# Patient Record
Sex: Male | Born: 1960 | Race: White | Hispanic: No | Marital: Married | Smoking: Never smoker
Health system: Southern US, Community
[De-identification: ages and names within clinical notes are randomized; demographics above are authoritative.]

## PROBLEM LIST (undated history)

## (undated) DIAGNOSIS — G709 Myoneural disorder, unspecified: Secondary | ICD-10-CM

## (undated) DIAGNOSIS — M1990 Unspecified osteoarthritis, unspecified site: Secondary | ICD-10-CM

## (undated) DIAGNOSIS — I739 Peripheral vascular disease, unspecified: Secondary | ICD-10-CM

## (undated) DIAGNOSIS — L409 Psoriasis, unspecified: Secondary | ICD-10-CM

## (undated) DIAGNOSIS — I493 Ventricular premature depolarization: Secondary | ICD-10-CM

## (undated) DIAGNOSIS — I499 Cardiac arrhythmia, unspecified: Secondary | ICD-10-CM

## (undated) DIAGNOSIS — K219 Gastro-esophageal reflux disease without esophagitis: Secondary | ICD-10-CM

## (undated) DIAGNOSIS — F319 Bipolar disorder, unspecified: Secondary | ICD-10-CM

## (undated) DIAGNOSIS — Z8489 Family history of other specified conditions: Secondary | ICD-10-CM

## (undated) DIAGNOSIS — I10 Essential (primary) hypertension: Secondary | ICD-10-CM

## (undated) HISTORY — DX: Ventricular premature depolarization: I49.3

## (undated) HISTORY — PX: WISDOM TOOTH EXTRACTION: SHX21

---

## 1998-04-08 ENCOUNTER — Ambulatory Visit (HOSPITAL_COMMUNITY): Admission: RE | Admit: 1998-04-08 | Discharge: 1998-04-08 | Payer: Self-pay | Admitting: Gynecology

## 2004-07-18 HISTORY — PX: NASAL SEPTUM SURGERY: SHX37

## 2005-07-21 ENCOUNTER — Ambulatory Visit: Payer: Self-pay | Admitting: Otolaryngology

## 2010-01-29 ENCOUNTER — Ambulatory Visit: Payer: Self-pay | Admitting: Unknown Physician Specialty

## 2014-07-02 DIAGNOSIS — M5440 Lumbago with sciatica, unspecified side: Secondary | ICD-10-CM | POA: Insufficient documentation

## 2014-07-02 DIAGNOSIS — M79606 Pain in leg, unspecified: Secondary | ICD-10-CM | POA: Insufficient documentation

## 2014-07-02 DIAGNOSIS — R200 Anesthesia of skin: Secondary | ICD-10-CM | POA: Insufficient documentation

## 2014-07-02 DIAGNOSIS — R202 Paresthesia of skin: Secondary | ICD-10-CM | POA: Insufficient documentation

## 2015-02-05 ENCOUNTER — Emergency Department: Payer: BC Managed Care – PPO

## 2015-02-05 ENCOUNTER — Inpatient Hospital Stay
Admission: EM | Admit: 2015-02-05 | Discharge: 2015-02-11 | DRG: 372 | Disposition: A | Discharge status: Home / Self Care | Payer: BC Managed Care – PPO | Attending: Internal Medicine | Admitting: Internal Medicine

## 2015-02-05 ENCOUNTER — Encounter: Payer: Self-pay | Admitting: Emergency Medicine

## 2015-02-05 DIAGNOSIS — E876 Hypokalemia: Secondary | ICD-10-CM | POA: Diagnosis present

## 2015-02-05 DIAGNOSIS — K219 Gastro-esophageal reflux disease without esophagitis: Secondary | ICD-10-CM | POA: Diagnosis present

## 2015-02-05 DIAGNOSIS — Z79899 Other long term (current) drug therapy: Secondary | ICD-10-CM | POA: Diagnosis not present

## 2015-02-05 DIAGNOSIS — A045 Campylobacter enteritis: Principal | ICD-10-CM | POA: Diagnosis present

## 2015-02-05 DIAGNOSIS — R140 Abdominal distension (gaseous): Secondary | ICD-10-CM

## 2015-02-05 DIAGNOSIS — I10 Essential (primary) hypertension: Secondary | ICD-10-CM | POA: Diagnosis present

## 2015-02-05 DIAGNOSIS — D899 Disorder involving the immune mechanism, unspecified: Secondary | ICD-10-CM | POA: Diagnosis present

## 2015-02-05 DIAGNOSIS — E871 Hypo-osmolality and hyponatremia: Secondary | ICD-10-CM | POA: Diagnosis present

## 2015-02-05 DIAGNOSIS — L409 Psoriasis, unspecified: Secondary | ICD-10-CM | POA: Diagnosis present

## 2015-02-05 DIAGNOSIS — K529 Noninfective gastroenteritis and colitis, unspecified: Secondary | ICD-10-CM

## 2015-02-05 DIAGNOSIS — F319 Bipolar disorder, unspecified: Secondary | ICD-10-CM | POA: Diagnosis present

## 2015-02-05 DIAGNOSIS — K922 Gastrointestinal hemorrhage, unspecified: Secondary | ICD-10-CM | POA: Diagnosis present

## 2015-02-05 DIAGNOSIS — R109 Unspecified abdominal pain: Secondary | ICD-10-CM

## 2015-02-05 DIAGNOSIS — R100 Acute abdomen: Secondary | ICD-10-CM | POA: Diagnosis present

## 2015-02-05 HISTORY — DX: Bipolar disorder, unspecified: F31.9

## 2015-02-05 HISTORY — DX: Psoriasis, unspecified: L40.9

## 2015-02-05 HISTORY — DX: Essential (primary) hypertension: I10

## 2015-02-05 HISTORY — DX: Gastro-esophageal reflux disease without esophagitis: K21.9

## 2015-02-05 LAB — COMPREHENSIVE METABOLIC PANEL
ALT: 30 U/L (ref 17–63)
ANION GAP: 10 (ref 5–15)
AST: 27 U/L (ref 15–41)
Albumin: 4.1 g/dL (ref 3.5–5.0)
Alkaline Phosphatase: 54 U/L (ref 38–126)
BUN: 14 mg/dL (ref 6–20)
CO2: 26 mmol/L (ref 22–32)
Calcium: 8.8 mg/dL — ABNORMAL LOW (ref 8.9–10.3)
Chloride: 98 mmol/L — ABNORMAL LOW (ref 101–111)
Creatinine, Ser: 0.93 mg/dL (ref 0.61–1.24)
GFR calc Af Amer: >60 mL/min (ref >60)
GFR calc non Af Amer: >60 mL/min (ref >60)
GLUCOSE: 127 mg/dL — AB (ref 65–99)
Potassium: 4.1 mmol/L (ref 3.5–5.1)
Sodium: 134 mmol/L — ABNORMAL LOW (ref 135–145)
TOTAL PROTEIN: 7 g/dL (ref 6.5–8.1)
Total Bilirubin: 0.7 mg/dL (ref 0.3–1.2)

## 2015-02-05 LAB — URINALYSIS COMPLETE WITH MICROSCOPIC (ARMC ONLY)
Bacteria, UA: NONE SEEN
Bilirubin Urine: NEGATIVE
Glucose, UA: NEGATIVE mg/dL
Hgb urine dipstick: NEGATIVE
Leukocytes, UA: NEGATIVE
NITRITE: NEGATIVE
PROTEIN: NEGATIVE mg/dL
pH: 6 (ref 5.0–8.0)

## 2015-02-05 LAB — PROTIME-INR
INR: 1.04
Prothrombin Time: 13.8 seconds (ref 11.4–15.0)

## 2015-02-05 LAB — CBC
HEMATOCRIT: 49.4 % (ref 40.0–52.0)
Hemoglobin: 16.6 g/dL (ref 13.0–18.0)
MCH: 31.1 pg (ref 26.0–34.0)
MCHC: 33.7 g/dL (ref 32.0–36.0)
MCV: 92.4 fL (ref 80.0–100.0)
PLATELETS: 227 10*3/uL (ref 150–440)
RBC: 5.35 MIL/uL (ref 4.40–5.90)
RDW: 13.5 % (ref 11.5–14.5)
WBC: 13.7 10*3/uL — AB (ref 3.8–10.6)

## 2015-02-05 LAB — C DIFFICILE QUICK SCREEN W PCR REFLEX
C Diff antigen: NEGATIVE
C Diff interpretation: NEGATIVE
C Diff toxin: NEGATIVE

## 2015-02-05 LAB — LIPASE, BLOOD: LIPASE: 11 U/L — AB (ref 22–51)

## 2015-02-05 LAB — TYPE AND SCREEN
ABO/RH(D): O POS
Antibody Screen: NEGATIVE

## 2015-02-05 LAB — HEMOGLOBIN AND HEMATOCRIT, BLOOD
HCT: 49.1 % (ref 40.0–52.0)
HEMATOCRIT: 46.7 % (ref 40.0–52.0)
HEMOGLOBIN: 15.8 g/dL (ref 13.0–18.0)
Hemoglobin: 16.8 g/dL (ref 13.0–18.0)

## 2015-02-05 LAB — OCCULT BLOOD X 1 CARD TO LAB, STOOL: FECAL OCCULT BLD: POSITIVE — AB

## 2015-02-05 LAB — ABO/RH: ABO/RH(D): O POS

## 2015-02-05 MED ORDER — ACETAMINOPHEN 325 MG PO TABS: 650.0000 mg | ORAL_TABLET | Freq: Four times a day (QID) | ORAL | Status: DC | PRN

## 2015-02-05 MED ORDER — ONDANSETRON HCL 4 MG/2ML IJ SOLN
4.0000 mg | Freq: Once | INTRAMUSCULAR | Status: AC
  Administered 2015-02-05: 4 mg via INTRAVENOUS

## 2015-02-05 MED ORDER — IOHEXOL 240 MG/ML SOLN
25.0000 mL | Freq: Once | INTRAMUSCULAR | Status: AC | PRN
  Administered 2015-02-05: 50 mL via ORAL

## 2015-02-05 MED ORDER — METRONIDAZOLE IN NACL 5-0.79 MG/ML-% IV SOLN
500.0000 mg | Freq: Once | INTRAVENOUS | Status: AC
  Administered 2015-02-05: 500 mg via INTRAVENOUS
  Filled 2015-02-05: qty 100

## 2015-02-05 MED ORDER — CIPROFLOXACIN IN D5W 400 MG/200ML IV SOLN
400.0000 mg | Freq: Two times a day (BID) | INTRAVENOUS | Status: DC
Start: 2015-02-06 — End: 2015-02-07
  Administered 2015-02-06 – 2015-02-07 (×3): 400 mg via INTRAVENOUS
  Filled 2015-02-05 (×6): qty 200

## 2015-02-05 MED ORDER — PANTOPRAZOLE SODIUM 40 MG PO TBEC
40.0000 mg | DELAYED_RELEASE_TABLET | Freq: Every day | ORAL | Status: DC
  Administered 2015-02-06 – 2015-02-11 (×6): 40 mg via ORAL
  Filled 2015-02-05 (×6): qty 1

## 2015-02-05 MED ORDER — ONDANSETRON HCL 4 MG/2ML IJ SOLN
4.0000 mg | Freq: Four times a day (QID) | INTRAMUSCULAR | Status: DC | PRN
  Administered 2015-02-05 – 2015-02-06 (×2): 4 mg via INTRAVENOUS
  Filled 2015-02-05 (×2): qty 2

## 2015-02-05 MED ORDER — OXYCODONE HCL 5 MG PO TABS
5.0000 mg | ORAL_TABLET | ORAL | Status: DC | PRN
  Administered 2015-02-06 – 2015-02-10 (×10): 5 mg via ORAL
  Filled 2015-02-05 (×10): qty 1

## 2015-02-05 MED ORDER — IOHEXOL 240 MG/ML SOLN: 25.0000 mL | Freq: Once | INTRAMUSCULAR | Status: DC | PRN

## 2015-02-05 MED ORDER — ONDANSETRON HCL 4 MG/2ML IJ SOLN
INTRAMUSCULAR | Status: AC
  Administered 2015-02-05: 4 mg via INTRAVENOUS
  Filled 2015-02-05: qty 2

## 2015-02-05 MED ORDER — BUPROPION HCL ER (XL) 150 MG PO TB24
150.0000 mg | ORAL_TABLET | Freq: Every day | ORAL | Status: DC
  Administered 2015-02-06 – 2015-02-11 (×6): 150 mg via ORAL
  Filled 2015-02-05 (×6): qty 1

## 2015-02-05 MED ORDER — PANTOPRAZOLE SODIUM 40 MG IV SOLR
40.0000 mg | Freq: Once | INTRAVENOUS | Status: AC
  Administered 2015-02-05: 40 mg via INTRAVENOUS
  Filled 2015-02-05: qty 40

## 2015-02-05 MED ORDER — METRONIDAZOLE IN NACL 5-0.79 MG/ML-% IV SOLN
500.0000 mg | Freq: Three times a day (TID) | INTRAVENOUS | Status: DC
  Administered 2015-02-05 – 2015-02-07 (×5): 500 mg via INTRAVENOUS
  Filled 2015-02-05 (×9): qty 100

## 2015-02-05 MED ORDER — IOHEXOL 350 MG/ML SOLN
100.0000 mL | Freq: Once | INTRAVENOUS | Status: AC | PRN
  Administered 2015-02-05: 100 mL via INTRAVENOUS

## 2015-02-05 MED ORDER — SODIUM CHLORIDE 0.9 % IV BOLUS (SEPSIS)
1000.0000 mL | Freq: Once | INTRAVENOUS | Status: AC
  Administered 2015-02-05: 1000 mL via INTRAVENOUS

## 2015-02-05 MED ORDER — PROMETHAZINE HCL 25 MG/ML IJ SOLN
12.5000 mg | Freq: Once | INTRAMUSCULAR | Status: AC
  Administered 2015-02-05: 12.5 mg via INTRAVENOUS
  Filled 2015-02-05: qty 1

## 2015-02-05 MED ORDER — METOPROLOL TARTRATE 25 MG PO TABS
25.0000 mg | ORAL_TABLET | Freq: Two times a day (BID) | ORAL | Status: DC
  Administered 2015-02-06: 25 mg via ORAL
  Filled 2015-02-05 (×2): qty 1

## 2015-02-05 MED ORDER — SODIUM CHLORIDE 0.9 % IV SOLN
Freq: Once | INTRAVENOUS | Status: AC
  Administered 2015-02-05: 10:00:00 via INTRAVENOUS

## 2015-02-05 MED ORDER — CIPROFLOXACIN IN D5W 400 MG/200ML IV SOLN
400.0000 mg | Freq: Once | INTRAVENOUS | Status: AC
  Administered 2015-02-05: 400 mg via INTRAVENOUS
  Filled 2015-02-05: qty 200

## 2015-02-05 MED ORDER — SODIUM CHLORIDE 0.9 % IV SOLN
INTRAVENOUS | Status: DC
  Administered 2015-02-05 (×2): via INTRAVENOUS

## 2015-02-05 MED ORDER — ONDANSETRON HCL 4 MG PO TABS: 4.0000 mg | ORAL_TABLET | Freq: Four times a day (QID) | ORAL | Status: DC | PRN

## 2015-02-05 MED ORDER — LAMOTRIGINE 25 MG PO TABS
75.0000 mg | ORAL_TABLET | Freq: Every day | ORAL | Status: DC
  Administered 2015-02-06 – 2015-02-11 (×6): 75 mg via ORAL
  Filled 2015-02-05 (×6): qty 3

## 2015-02-05 MED ORDER — MORPHINE SULFATE 2 MG/ML IJ SOLN
2.0000 mg | INTRAMUSCULAR | Status: DC | PRN
  Administered 2015-02-05 – 2015-02-07 (×3): 2 mg via INTRAVENOUS
  Filled 2015-02-05 (×3): qty 1

## 2015-02-05 MED ORDER — SODIUM CHLORIDE 0.9 % IV SOLN
INTRAVENOUS | Status: DC
  Administered 2015-02-06 – 2015-02-07 (×2): via INTRAVENOUS

## 2015-02-05 MED ORDER — ACETAMINOPHEN 650 MG RE SUPP: 650.0000 mg | Freq: Four times a day (QID) | RECTAL | Status: DC | PRN

## 2015-02-05 NOTE — ED Provider Notes (Signed)
[facility] Emergency Department Provider Note ____________________________________________  Time seen: Approximately 9:13 AM  I have reviewed the triage vital signs and the nursing notes.   HISTORY  Chief Complaint Diarrhea and Blood In Stools  HPI Bruce Chambers. is a 54 y.o. male who started having midepigastric abdominal pain yesterday morning and throughout the day his pain continued and he was having diarrhea. Patient started passing bright red blood per rectum that got worse this morning. Patient states that he had several stools this morning with bright red blood. Patient also states that he has had some nausea with retching but no actual vomiting. Patient does take a Goody powder every day and he does drink alcohol. Patient denies any fever, but the wife states that he felt hot yesterday and he was having chills. Patient denies any cough, congestion, dysuria, frequency, hematuria, or back pain. Patient states that this morning after more episodes of diarrhea and rectal bleeding he started to have some dizziness especially with standing. On arrival to the ER patient stated that he felt like he was dehydrated.   Past Medical History  Diagnosis Date  . Hypertension   . Bipolar 1 disorder   . GERD (gastroesophageal reflux disease)   . Psoriasis     There are no active problems to display for this patient.   History reviewed. No pertinent past surgical history.  Current Outpatient Rx  Name  Route  Sig  Dispense  Refill  . Adalimumab 40 MG/0.8ML PNKT   Subcutaneous   Inject 40 mg into the skin every 14 (fourteen) days.         Marland Kitchen buPROPion (WELLBUTRIN XL) 150 MG 24 hr tablet   Oral   Take 1 tablet by mouth daily.         Marland Kitchen lamoTRIgine (LAMICTAL) 150 MG tablet   Oral   Take 75 mg by mouth daily.           Allergies Review of patient's allergies indicates no known allergies.  No family history on file.  Social History History   Substance Use Topics  . Smoking status: Never Smoker   . Smokeless tobacco: Not on file  . Alcohol Use: Not on file    Review of Systems Constitutional: No fever/chills Eyes: No visual changes. ENT: No sore throat. Cardiovascular: Denies chest pain. Respiratory: Denies shortness of breath. Gastrointestinal: Patient complaining of midepigastric abdominal pain.  Patient with nausea and retching. Patient multiple episodes of diarrhea and bright red rectal bleeding.  No constipation. Genitourinary: Negative for dysuria. Musculoskeletal: Negative for back pain. Skin: Negative for rash. Neurological: Negative for headaches, focal weakness or numbness.  10-point ROS otherwise negative.  ____________________________________________   PHYSICAL EXAM:  VITAL SIGNS: ED Triage Vitals  Enc Vitals Group     BP 02/05/15 0837 172/104 mmHg     Pulse --      Resp 02/05/15 0837 20     Temp 02/05/15 0837 97.8 F (36.6 C)     Temp Source 02/05/15 0837 Oral     SpO2 02/05/15 0837 98 %     Weight 02/05/15 0837 200 lb (90.719 kg)     Height 02/05/15 0837 6\' 1"  (1.854 m)     Head Cir --      Peak Flow --      Pain Score 02/05/15 0838 5     Pain Loc --      Pain Edu? --      Excl. in [place]? --  Constitutional: Alert and oriented. Well appearing and in mild distress secondary to his pain. Eyes: Conjunctivae are normal. PERRL. EOMI. Head: Atraumatic. Nose: No congestion/rhinnorhea. Mouth/Throat: Mucous membranes are dry.  Oropharynx non-erythematous. Neck: No stridor.   Cardiovascular: Normal rate, regular rhythm. Grossly normal heart sounds.  Good peripheral circulation. Respiratory: Normal respiratory effort.  No retractions. Lungs CTAB. Gastrointestinal: Soft and tender to the palpation in the midepigastric area. No distention. No abdominal bruits. No CVA tenderness. Hemoccult showed patient had no significant blood in the vault but he was grossly heme positive Musculoskeletal: No lower  extremity tenderness nor edema.  No joint effusions. Neurologic:  Normal speech and language. No gross focal neurologic deficits are appreciated. No gait instability. Skin:  Skin is warm, dry and intact. No rash noted. Psychiatric: Mood and affect are normal. Speech and behavior are normal.  ____________________________________________   LABS (all labs ordered are listed, but only abnormal results are displayed)  Labs Reviewed  CBC - Abnormal; Notable for the following:    WBC 13.7 (*)    All other components within normal limits  COMPREHENSIVE METABOLIC PANEL - Abnormal; Notable for the following:    Sodium 134 (*)    Chloride 98 (*)    Glucose, Bld 127 (*)    Calcium 8.8 (*)    All other components within normal limits  LIPASE, BLOOD - Abnormal; Notable for the following:    Lipase 11 (*)    All other components within normal limits  PROTIME-INR  URINALYSIS COMPLETEWITH MICROSCOPIC ([facility] ONLY)   ____________________________________________  EKG ED ECG REPORT I, Ruby Cola, the attending physician, personally viewed and interpreted this ECG.   Date: 02/05/2015  EKG Time: 8:44 AM  Rate: 68  Rhythm: normal EKG, normal sinus rhythm, unchanged from previous tracings, normal sinus rhythm  Axis: Normal  Intervals:none  ST&T Change: None  ____________________________________________  RADIOLOGY Ct Abdomen Pelvis W Contrast  02/05/2015   CLINICAL DATA:  Patient presents to the emergency department for diarrhea since yesterday and bright red bloody stools this morning. Patient also describes centralized abdominal pain above umbilicus.  EXAM: CT ABDOMEN AND PELVIS WITH CONTRAST  TECHNIQUE: Multidetector CT imaging of the abdomen and pelvis was performed using the standard protocol following bolus administration of intravenous contrast.  CONTRAST:  168mL OMNIPAQUE IOHEXOL 350 MG/ML SOLN  COMPARISON:  None.  FINDINGS: There is prominent thickening of the walls of the transverse  colon and right colon. There is associated paracolic fluid stranding adjacent to the right colon. There is milder thickening of the walls of the descending colon. Rectosigmoid colon appears normal. No large bowel dilatation. Small bowel is normal in caliber and configuration. Stomach is unremarkable.  Liver, gallbladder, spleen, pancreas, adrenal glands, and kidneys appear normal. Abdominal aorta is normal in caliber and configuration. No circumscribed fluid collection or abscess collection identified in the abdomen or pelvis. No free intraperitoneal air. No pneumatosis intestinalis.  Lung bases are clear. Degenerative changes noted throughout the thoracolumbar spine, mild moderate in degree, but no acute osseous abnormality.  IMPRESSION: Pronounced thickening of the walls of the ascending colon and transverse colon, with milder thickening of the walls of the descending colon, consistent with a diffuse colitis of infectious or inflammatory nature. There is associated paracolic fluid stranding/inflammation. No paracolic abscess collection seen. No free intraperitoneal air. No associated bowel obstruction.  Remainder of the abdomen and pelvis CT examination is unremarkable, as detailed above.   Electronically Signed   By: Roxy Horseman.D.  On: 02/05/2015 11:53   ____________________________________________   PROCEDURES  Procedure(s) performed: None  Critical Care performed: No  ____________________________________________   INITIAL IMPRESSION / ASSESSMENT AND PLAN / ED COURSE  Pertinent labs & imaging results that were available during my care of the patient were reviewed by me and considered in my medical decision making (see chart for details).  ----------------------------------------- 9:18 AM on 02/05/2015 -----------------------------------------  Patient will be given some IV morphine and Zofran along with some IV fluids. Patient will also get CT abdomen and pelvis to evaluate his  abdominal pain and be given some IV Protonix for possible peptic ulcer. ____________________________________________  ----------------------------------------- 12:51 PM on 02/05/2015 -----------------------------------------  Patient's CT scan showed diffuse colitis. Patient was started on IV Cipro and Flagyl in the ED and he is going to be admitted by Dr.Gouru for colitis and GI bleed.  FINAL CLINICAL IMPRESSION(S) / ED DIAGNOSES  Final diagnoses:  Acute abdominal pain  Gastrointestinal hemorrhage, unspecified gastritis, unspecified gastrointestinal hemorrhage type  Colitis      Ruby Cola, MD 02/05/15 1253

## 2015-02-05 NOTE — Consult Note (Signed)
Pt with severe onset of diarrhea follow by bloody diarrhea and vomiting.  CT scan showed thickening of the walls of the transverse and right colon with some paracolic fluid stranding.  He was placed on Cipro and Flagyl in ER before cultures obtained.  This is likely food poisoning but can't rule out other causes.  He recently completed a trip white water rafting at the [facility] and could have picked something up there.  I agree with the antibiotics and told the patient he will likely be here 3-4 days more at least.  We will follow with you.

## 2015-02-05 NOTE — ED Notes (Signed)
Patient presents to the ED for diarrhea since yesterday and bright red bloody stools this morning.  Patient reports feeling nauseous and dry heaving.  Denies vomiting.  Patient reports centralized abdominal pain above his umbilicus.  Patient reports area is slightly tender.  Patient reports fever yesterday that broke after taking ibuprofen.  Patient reports weakness and dizziness.

## 2015-02-05 NOTE — H&P (Signed)
[facility] NAME: Bruce Chambers    MR#:  [id number]  DATE OF BIRTH:  [birth year]  DATE OF ADMISSION:  02/05/2015  PRIMARY CARE PHYSICIAN: No primary care provider on file.   REQUESTING/REFERRING PHYSICIAN: Dr. Lovena Le  CHIEF COMPLAINT:  Diarrhea with blood and epigastric abdominal pain  HISTORY OF PRESENT ILLNESS:  Bruce Chambers  is a 54 y.o. male with a known history of GERD, bipolar disorder, hypertension and psoriasis is presenting to the ED with a chief complaint of midepigastric abdominal pain from yesterday morning associated with diarrhea with maroon-colored blood. These symptoms have gotten much worse from last night which prompted him to come to the ED. Patient admits that he takes [place] powders on daily basis. Denies any similar complaints in the past. Denies any use of antibiotics recently. Denies any dizziness or loss of consciousness. Denies any nausea or vomiting. CAT scan of the abdomen has revealed colitis of the colon. Patient's hemoglobin in the ED is at 16.6. He has received ciprofloxacin and Flagyl in the ED by the ED physician. Hospitalist team is called to admit the patient. During my examination patient is resting comfortably but still has some epigastric abdominal pain. Wife is at bedside  PAST MEDICAL HISTORY:   Past Medical History  Diagnosis Date  . Hypertension   . Bipolar 1 disorder   . GERD (gastroesophageal reflux disease)   . Psoriasis     PAST SURGICAL HISTOIRY:  History reviewed. No pertinent past surgical history.  SOCIAL HISTORY:   History  Substance Use Topics  . Smoking status: Never Smoker   . Smokeless tobacco: Not on file  . Alcohol Use: Not on file    FAMILY HISTORY:  No family history on file.  DRUG ALLERGIES:  No Known Allergies  REVIEW OF SYSTEMS:  CONSTITUTIONAL: No fever, fatigue or weakness.  EYES: No blurred or double vision.  EARS, NOSE, AND THROAT: No tinnitus or  ear pain.  RESPIRATORY: No cough, shortness of breath, wheezing or hemoptysis.  CARDIOVASCULAR: No chest pain, orthopnea, edema.  GASTROINTESTINAL: No nausea, vomiting, reporting maroon-colored bloody diarrhea and midepigastric abdominal pain.  GENITOURINARY: No dysuria, hematuria.  ENDOCRINE: No polyuria, nocturia,  HEMATOLOGY: No anemia, easy bruising or bleeding SKIN: No rash or lesion. MUSCULOSKELETAL: No joint pain or arthritis.   NEUROLOGIC: No tingling, numbness, weakness.  PSYCHIATRY: No anxiety or depression.   MEDICATIONS AT HOME:   Prior to Admission medications   Medication Sig Start Date End Date Taking? Authorizing Provider  Adalimumab 40 MG/0.8ML PNKT Inject 40 mg into the skin every 14 (fourteen) days.   Yes Historical Provider, MD  buPROPion (WELLBUTRIN XL) 150 MG 24 hr tablet Take 1 tablet by mouth daily.   Yes Historical Provider, MD  lamoTRIgine (LAMICTAL) 150 MG tablet Take 75 mg by mouth daily.   Yes Historical Provider, MD      VITAL SIGNS:  Blood pressure 172/106, pulse 69, temperature 97.8 F (36.6 C), temperature source Oral, resp. rate 27, height 6\' 1"  (1.854 m), weight 90.719 kg (200 lb), SpO2 99 %.  PHYSICAL EXAMINATION:  GENERAL:  54 y.o.-year-old patient lying in the bed with no acute distress.  EYES: Pupils equal, round, reactive to light and accommodation. No scleral icterus. Extraocular muscles intact.  HEENT: Head atraumatic, normocephalic. Oropharynx and nasopharynx clear.  NECK:  Supple, no jugular venous distention. No thyroid enlargement, no tenderness.  LUNGS: Normal breath sounds bilaterally, no wheezing, rales,rhonchi or crepitation. No  use of accessory muscles of respiration.  CARDIOVASCULAR: S1, S2 normal. No murmurs, rubs, or gallops.  ABDOMEN: Soft, epigastric tenderness, some distention, no rebound tenderness . Bowel sounds present. No organomegaly or mass.  EXTREMITIES: No pedal edema, cyanosis, or clubbing.  NEUROLOGIC: Cranial  nerves II through XII are intact. Muscle strength 5/5 in all extremities. Sensation intact. Gait not checked.  PSYCHIATRIC: The patient is alert and oriented x 3.  SKIN: No obvious rash, lesion, or ulcer.   LABORATORY PANEL:   CBC  Recent Labs Lab 02/05/15 0935  WBC 13.7*  HGB 16.6  HCT 49.4  PLT 227   ------------------------------------------------------------------------------------------------------------------  Chemistries   Recent Labs Lab 02/05/15 0935  NA 134*  K 4.1  CL 98*  CO2 26  GLUCOSE 127*  BUN 14  CREATININE 0.93  CALCIUM 8.8*  AST 27  ALT 30  ALKPHOS 54  BILITOT 0.7   ------------------------------------------------------------------------------------------------------------------  Cardiac Enzymes No results for input(s): TROPONINI in the last 168 hours. ------------------------------------------------------------------------------------------------------------------  RADIOLOGY:  Ct Abdomen Pelvis W Contrast  02/05/2015   CLINICAL DATA:  Patient presents to the emergency department for diarrhea since yesterday and bright red bloody stools this morning. Patient also describes centralized abdominal pain above umbilicus.  EXAM: CT ABDOMEN AND PELVIS WITH CONTRAST  TECHNIQUE: Multidetector CT imaging of the abdomen and pelvis was performed using the standard protocol following bolus administration of intravenous contrast.  CONTRAST:  115mL OMNIPAQUE IOHEXOL 350 MG/ML SOLN  COMPARISON:  None.  FINDINGS: There is prominent thickening of the walls of the transverse colon and right colon. There is associated paracolic fluid stranding adjacent to the right colon. There is milder thickening of the walls of the descending colon. Rectosigmoid colon appears normal. No large bowel dilatation. Small bowel is normal in caliber and configuration. Stomach is unremarkable.  Liver, gallbladder, spleen, pancreas, adrenal glands, and kidneys appear normal. Abdominal aorta is  normal in caliber and configuration. No circumscribed fluid collection or abscess collection identified in the abdomen or pelvis. No free intraperitoneal air. No pneumatosis intestinalis.  Lung bases are clear. Degenerative changes noted throughout the thoracolumbar spine, mild moderate in degree, but no acute osseous abnormality.  IMPRESSION: Pronounced thickening of the walls of the ascending colon and transverse colon, with milder thickening of the walls of the descending colon, consistent with a diffuse colitis of infectious or inflammatory nature. There is associated paracolic fluid stranding/inflammation. No paracolic abscess collection seen. No free intraperitoneal air. No associated bowel obstruction.  Remainder of the abdomen and pelvis CT examination is unremarkable, as detailed above.   Electronically Signed   By: Franki Cabot M.D.   On: 02/05/2015 11:53    EKG:  No orders found for this or any previous visit.  IMPRESSION AND PLAN:   Bruce Chambers  is a 54 y.o. male with a known history of GERD, bipolar disorder, hypertension and psoriasis is presenting to the ED with a chief complaint of midepigastric abdominal pain from yesterday morning associated with diarrhea with maroon-colored blood. CT abdomen has revealed diffuse colitis  Assessment and plan  #1 lower GI bleed secondary to diffuse colitis Will admit the patient to MedSurg unit Type and screen the blood Monitor hemoglobin and hematocrit every 6 hours and transfuse as needed basis. We will provide Protonix Gastroenterology consult is placed Will provide hydration with IV fluids Check stool for Hemoccult, will get Clostridium difficult   #2 epigastric abdominal pain with history of GERD and taking over-the-counter Goody powders on daily basis-could be  from end-stage induced gastritis versus gastroduodenal ulcer Advised patient to stop using over-the-counter Goody powders Avoid NSAIDs GI prophylaxis with Protonix GI consult  is placed  #3 history of bipolar disorder Resume his home medications  #4 history of hypertension with elevated blood pressure The patient is not on any home medications. Will continue close monitoring of his blood pressure We will start the patient on metoprolol and titrate as needed basis  Will provide GI prophylaxis with Protonix and DVT prophylaxis with SCDs  All the records are reviewed and case discussed with ED provider. Management plans discussed with the patient, family and they are in agreement.  CODE STATUS: Full code, wife is the healthcare power of attorney  TOTAL TIME TAKING CARE OF THIS PATIENT reviewing medical records, performing history and physical, admission orders, coordination of care, discussion with the RN and ED physician,:45 minutes      Nicholes Mango M.D on 02/05/2015 at 1:33 PM  Between 7am to 6pm - Pager - [phone]  After 6pm go to [email or web address] - password [email or web address]  [facility]  Office  [phone]  CC: Primary care physician; No primary care provider on file.

## 2015-02-05 NOTE — Progress Notes (Signed)
Spoke with Dr. Marcille Blanco about pts IV fluid order - is ordered to stop at midnight, however, pt not taking POs well, nauseated, still with loose stools - questioning whether to stop fluids - MD order ok to keep fluids continuous.

## 2015-02-05 NOTE — Consult Note (Signed)
GI Inpatient Consult Note  Reason for Consult: Colitis with GI bleed   Attending Requesting Consult: Dr. Mar Daring  History of Present Illness: Bruce Chambers. is a 54 y.o. male who reports that he started having diarrhea yesterday.  He reports he had countless episodes.  They started out as stool and then went to just liquid blood.  He described it as pure red blood.  He reports his normal bowel movements are  daily soft formed.  He reports acute epigastric pain that started at the same time.  He also reports nausea and vomiting started this morning.  He has been unable to hold down any type of liquids.  His wife was in the room with them and gave the majority of the history.  She reported that he felt really hot last night, but he would not allow her to take his temperature.  He has had a weight  loss of 10 pounds in the last few months.  They attribute that to extra exercise, he recently retired and is working outside a lot more.  He reports that he had been taking Nexium for GERD symptoms but decided not to take it anymore.  He reports he only gets heartburn and acid reflux in the morning with coffee.  He also reports that he takes [place] powders every day, for the last 6 years.  He reports he takes them for degenerative disc disease.  He drinks 2 glasses of wine every day and will eat late at night.   Of note, he just returned from a white water rafting trip at the [facility] on July 12. His wife reports that a few nights after they returned she had nausea, diarrhea and low grade fever for a couple of days.  He self reports that he had an upper endoscopy and a colonoscopy done approximately 5 years ago.  Past Medical History:  Past Medical History  Diagnosis Date  . Hypertension   . Bipolar 1 disorder   . GERD (gastroesophageal reflux disease)   . Psoriasis     Problem List: Patient Active Problem List   Diagnosis Date Noted  . Acute lower GI bleeding 02/05/2015    Past  Surgical History: History reviewed. No pertinent past surgical history.  Allergies: No Known Allergies  Home Medications: Prescriptions prior to admission  Medication Sig Dispense Refill Last Dose  . Adalimumab 40 MG/0.8ML PNKT Inject 40 mg into the skin every 14 (fourteen) days.   Past Week at Unknown time  . buPROPion (WELLBUTRIN XL) 150 MG 24 hr tablet Take 1 tablet by mouth daily.   02/04/2015 at Unknown time  . lamoTRIgine (LAMICTAL) 150 MG tablet Take 75 mg by mouth daily.   02/04/2015 at Unknown time   Home medication reconciliation was completed with the patient.   Scheduled Inpatient Medications:   . buPROPion  150 mg Oral Daily  . [START ON 02/06/2015] ciprofloxacin  400 mg Intravenous Q12H  . lamoTRIgine  75 mg Oral Daily  . metoprolol tartrate  25 mg Oral BID  . metronidazole  500 mg Intravenous Q8H  . pantoprazole  40 mg Oral Daily    Continuous Inpatient Infusions:   . sodium chloride 100 mL/hr at 02/05/15 1401    PRN Inpatient Medications:  acetaminophen **OR** acetaminophen, morphine injection, ondansetron **OR** ondansetron (ZOFRAN) IV, oxyCODONE  Family History: family history is not on file.  The patient's family history is negative for inflammatory bowel disorders, GI malignancy, or solid organ transplantation.  Social  History:   reports that he has never smoked. He does not have any smokeless tobacco history on file. He reports that he drinks about 1.8 oz of alcohol per week. The patient denies ETOH, tobacco, or drug use.   Review of Systems: Constitutional: 10 pound weight loss over past 2-3 months Eyes: No changes in vision. ENT: No oral lesions, sore throat.  GI: see HPI.  Heme/Lymph: No easy bruising.  CV: No chest pain.  GU: No hematuria.  Integumentary: No rashes.  Neuro: No headaches.  Psych: No depression/anxiety.  Endocrine: No heat/cold intolerance.  Allergic/Immunologic: No urticaria.  Resp: No cough, SOB.  Musculoskeletal: No joint  swelling.    Physical Examination: BP 182/104 mmHg  Pulse 74  Temp(Src) 98.4 F (36.9 C) (Oral)  Resp 16  Ht 6\' 1"  (1.854 m)  Wt 93.532 kg (206 lb 3.2 oz)  BMI 27.21 kg/m2  SpO2 97% Gen: alert and oriented x 4, in mild distress, wife gave the majority of the history HEENT: PEERLA, EOMI, Neck: supple, no JVD or thyromegaly Chest: CTA bilaterally, no wheezes, crackles, or other adventitious sounds CV: RRR, no m/g/c/r Abd: soft, epigastric tenderness, ND, +BS in all four quadrants; no HSM, guarding, ridigity, or rebound tenderness Ext: no edema, well perfused with 2+ pulses, Skin: no rash or lesions noted Lymph: no LAD  Data: Lab Results  Component Value Date   WBC 13.7* 02/05/2015   HGB 15.8 02/05/2015   HCT 46.7 02/05/2015   MCV 92.4 02/05/2015   PLT 227 02/05/2015    Recent Labs Lab 02/05/15 0935 02/05/15 1453  HGB 16.6 15.8   Lab Results  Component Value Date   NA 134* 02/05/2015   K 4.1 02/05/2015   CL 98* 02/05/2015   CO2 26 02/05/2015   BUN 14 02/05/2015   CREATININE 0.93 02/05/2015   Lab Results  Component Value Date   ALT 30 02/05/2015   AST 27 02/05/2015   ALKPHOS 54 02/05/2015   BILITOT 0.7 02/05/2015    Recent Labs Lab 02/05/15 0935  INR 1.04   Imaging: CLINICAL DATA: Patient presents to the emergency department for diarrhea since yesterday and bright red bloody stools this morning. Patient also describes centralized abdominal pain above umbilicus.  EXAM: CT ABDOMEN AND PELVIS WITH CONTRAST  TECHNIQUE: Multidetector CT imaging of the abdomen and pelvis was performed using the standard protocol following bolus administration of intravenous contrast.  CONTRAST: 167mL OMNIPAQUE IOHEXOL 350 MG/ML SOLN  COMPARISON: None.  FINDINGS: There is prominent thickening of the walls of the transverse colon and right colon. There is associated paracolic fluid stranding adjacent to the right colon. There is milder thickening of the  walls of the descending colon. Rectosigmoid colon appears normal. No large bowel dilatation. Small bowel is normal in caliber and configuration. Stomach is unremarkable.  Liver, gallbladder, spleen, pancreas, adrenal glands, and kidneys appear normal. Abdominal aorta is normal in caliber and configuration. No circumscribed fluid collection or abscess collection identified in the abdomen or pelvis. No free intraperitoneal air. No pneumatosis intestinalis.  Lung bases are clear. Degenerative changes noted throughout the thoracolumbar spine, mild moderate in degree, but no acute osseous abnormality.  IMPRESSION: Pronounced thickening of the walls of the ascending colon and transverse colon, with milder thickening of the walls of the descending colon, consistent with a diffuse colitis of infectious or inflammatory nature. There is associated paracolic fluid stranding/inflammation. No paracolic abscess collection seen. No free intraperitoneal air. No associated bowel obstruction.  Remainder of the abdomen  and pelvis CT examination is unremarkable, as detailed above.   Electronically Signed  By: Franki Cabot M.D.  On: 02/05/2015 11:53  Assessment/Plan: Mr. Brosh is a 54 y.o. male with GI bleed   Recommendations:  While I was in the room I did witness one of his bowel movements.  It was a maroon-colored liquid without stool in it.  The patient still has nausea and was experiencing dry heaves while I was in the room as well.  We believe this is infectious cause as until proven otherwise.   It does not appear that stool was tested for any parasites.  C diff is negative.  We agree with continuing with the Cipro and Flagyl and PPI coverage.  I spent time counseling patient on discontinuing all NSAIDs use and following up with the GI provider on discharge.  Also recommended that he follow-up with his primary care provider for other ways to manage his pain other than NSAIDs.  We  will continue to follow with you.  Thank you for the consult. Please call with questions or concerns.  Salvadore Farber, PA-C  I personally performed these services.

## 2015-02-06 LAB — COMPREHENSIVE METABOLIC PANEL
ALT: 20 U/L (ref 17–63)
AST: 17 U/L (ref 15–41)
Albumin: 3.2 g/dL — ABNORMAL LOW (ref 3.5–5.0)
Alkaline Phosphatase: 42 U/L (ref 38–126)
Anion gap: 8 (ref 5–15)
BUN: 10 mg/dL (ref 6–20)
CALCIUM: 7.9 mg/dL — AB (ref 8.9–10.3)
CO2: 22 mmol/L (ref 22–32)
Chloride: 101 mmol/L (ref 101–111)
Creatinine, Ser: 0.64 mg/dL (ref 0.61–1.24)
GFR calc Af Amer: >60 mL/min (ref >60)
GLUCOSE: 138 mg/dL — AB (ref 65–99)
Potassium: 3.4 mmol/L — ABNORMAL LOW (ref 3.5–5.1)
Sodium: 131 mmol/L — ABNORMAL LOW (ref 135–145)
Total Bilirubin: 0.5 mg/dL (ref 0.3–1.2)
Total Protein: 5.9 g/dL — ABNORMAL LOW (ref 6.5–8.1)

## 2015-02-06 LAB — CBC
HEMATOCRIT: 50.2 % (ref 40.0–52.0)
Hemoglobin: 17 g/dL (ref 13.0–18.0)
MCH: 31.1 pg (ref 26.0–34.0)
MCHC: 33.9 g/dL (ref 32.0–36.0)
MCV: 91.5 fL (ref 80.0–100.0)
Platelets: 192 10*3/uL (ref 150–440)
RBC: 5.48 MIL/uL (ref 4.40–5.90)
RDW: 13.5 % (ref 11.5–14.5)
WBC: 23.3 10*3/uL — AB (ref 3.8–10.6)

## 2015-02-06 LAB — PROTIME-INR
INR: 1.11
Prothrombin Time: 14.5 seconds (ref 11.4–15.0)

## 2015-02-06 MED ORDER — HYDRALAZINE HCL 20 MG/ML IJ SOLN: 10.0000 mg | Freq: Four times a day (QID) | INTRAMUSCULAR | Status: DC | PRN

## 2015-02-06 MED ORDER — MIDAZOLAM HCL 2 MG/2ML IJ SOLN
2.0000 mg | Freq: Once | INTRAMUSCULAR | Status: AC
  Administered 2015-02-06: 2 mg via INTRAVENOUS
  Filled 2015-02-06: qty 2

## 2015-02-06 MED ORDER — METOPROLOL TARTRATE 25 MG PO TABS
25.0000 mg | ORAL_TABLET | Freq: Once | ORAL | Status: AC
  Administered 2015-02-06: 25 mg via ORAL
  Filled 2015-02-06: qty 1

## 2015-02-06 MED ORDER — METOPROLOL TARTRATE 50 MG PO TABS
50.0000 mg | ORAL_TABLET | Freq: Two times a day (BID) | ORAL | Status: DC
  Administered 2015-02-06 – 2015-02-10 (×9): 50 mg via ORAL
  Filled 2015-02-06 (×10): qty 1

## 2015-02-06 NOTE — Progress Notes (Signed)
Pt requesting something for sleep - states he is not in pain - isn't tolerating POs well - Dr. Marcille Blanco notified; also discussed BP - MD to order medication

## 2015-02-06 NOTE — Care Management (Signed)
Spoke with patient for discharge planning, Independent from home with spouse, no needs identified.

## 2015-02-06 NOTE — Progress Notes (Signed)
Initial Nutrition Assessment    INTERVENTION:   Meals and snacks: Cater to pt preferences Nutrition Supplement Therapy: If unable to progress diet recommend adding boost breeze TID  NUTRITION DIAGNOSIS:   Inadequate oral intake related to altered GI function as evidenced by other (see comment) (NPO/CL diet).    GOAL:   Patient will meet greater than or equal to 90% of their needs    MONITOR:    (Energy intake, Digestive system)  REASON FOR ASSESSMENT:   Malnutrition Screening Tool    ASSESSMENT:   Pt admitted with lower GI bleed, bloody diarrhea, nausea.  Past Medical History  Diagnosis Date  . Hypertension   . Bipolar 1 disorder   . GERD (gastroesophageal reflux disease)   . Psoriasis     Current Nutrition: tolerating liquids  Food/Nutrition-Related History: pt reports good/normal appetite prior to admission.  Has changed diet over the last 6 months for the better, eating better foods after retiring   Medications: NS at 146ml/hr, protonix  Electrolyte/Renal Profile and Glucose Profile:   Recent Labs Lab 02/05/15 0935 02/06/15 0432  NA 134* 131*  K 4.1 3.4*  CL 98* 101  CO2 26 22  BUN 14 10  CREATININE 0.93 0.64  CALCIUM 8.8* 7.9*  GLUCOSE 127* 138*   Protein Profile:  Recent Labs Lab 02/05/15 0935 02/06/15 0432  ALBUMIN 4.1 3.2*   Nutritional Anemia Profile:  CBC Latest Ref Rng 02/06/2015 02/05/2015 02/05/2015  WBC 3.8 - 10.6 K/uL 23.3(H) - -  Hemoglobin 13.0 - 18.0 g/dL 17.0 16.8 15.8  Hematocrit 40.0 - 52.0 % 50.2 49.1 46.7  Platelets 150 - 440 K/uL 192 - -      Last BM: bloody diarrhea      Weight Change: Pt reports weight loss of 10 pounds in the last 6 months (5% weight loss in  6 months). 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    Diet Order:  Diet clear liquid Room service appropriate?: Yes; Fluid consistency:: Thin  Skin:  Reviewed, no  issues   Height:   Ht Readings from Last 1 Encounters:  02/05/15 6\' 1"  (1.854 m)    Weight:   Wt Readings from Last 1 Encounters:  02/05/15 206 lb 3.2 oz (93.532 kg)       Wt Readings from Last 10 Encounters:  02/05/15 206 lb 3.2 oz (93.532 kg)    BMI:  Body mass index is 27.21 kg/(m^2).   EDUCATION NEEDS:   No education needs identified at this time  LOW Care Level  Meisha Salone B. Zenia Resides, [place], [place] (pager)

## 2015-02-06 NOTE — Consult Note (Signed)
Pt with frank bleeding with diarrhea.  Hemorrhagic E. Coli is possible.  Continue meds. He wishes to shower and I gave permission to his nurse.  WBC need repeat in morning.

## 2015-02-06 NOTE — Consult Note (Signed)
Pt still with bloody diarrhea, minimal abd pain and tenderness, only slight pain with deep cough.  Nausea and  infreq dry heaves.  Abd with bowel sounds present, not distended.  WBC 23K.  Dr. Candace Cruise to cover this weekend.  He will see patient Saturday and Sunday.  BP up some and being addressed by Hospitalist.

## 2015-02-06 NOTE — Progress Notes (Signed)
[place] at [facility]                                                                                                                                                                                            Patient Demographics   Bruce Chambers, is a 54 y.o. male, DOB - 1961-04-20, PYP:[id number]  Admit date - 02/05/2015   Admitting Physician Nicholes Mango, MD  Outpatient Primary MD for the patient is No primary care provider on file.   LOS - 1  Subjective: Patient continues to have diarrhea every 15 minutes. And has blood in the stool. Denies any significant abdominal pain     Review of Systems:   CONSTITUTIONAL: No documented fever. No fatigue, weakness. No weight gain, no weight loss.  EYES: No blurry or double vision.  ENT: No tinnitus. No postnasal drip. No redness of the oropharynx.  RESPIRATORY: No cough, no wheeze, no hemoptysis. No dyspnea.  CARDIOVASCULAR: No chest pain. No orthopnea. No palpitations. No syncope.  GASTROINTESTINAL: No nausea, no vomiting , positive bloody diarrhea. No abdominal pain. No melena or hematochezia.  GENITOURINARY: No dysuria or hematuria.  ENDOCRINE: No polyuria or nocturia. No heat or cold intolerance.  HEMATOLOGY: No anemia. No bruising. No bleeding.  INTEGUMENTARY: No rashes. No lesions.  MUSCULOSKELETAL: No arthritis. No swelling. No gout.  NEUROLOGIC: No numbness, tingling, or ataxia. No seizure-type activity.  PSYCHIATRIC: No anxiety. No insomnia. No ADD.    Vitals:   Filed Vitals:   02/06/15 0034 02/06/15 0446 02/06/15 0741 02/06/15 0800  BP: 173/86 169/91 172/96 152/86  Pulse: 65 62 79   Temp: 98.6 F (37 C) 98.4 F (36.9 C) 97.7 F (36.5 C)   TempSrc: Oral Oral Oral   Resp:   19   Height:      Weight:      SpO2: 98% 99% 98%     Wt Readings from Last 3 Encounters:  02/05/15 93.532 kg (206 lb 3.2 oz)     Intake/Output Summary (Last 24 hours) at 02/06/15 1222 Last data filed at  02/06/15 1050  Gross per 24 hour  Intake 1934.23 ml  Output    603 ml  Net 1331.23 ml    Physical Exam:   GENERAL: Pleasant-appearing in no apparent distress.  HEAD, EYES, EARS, NOSE AND THROAT: Atraumatic, normocephalic. Extraocular muscles are intact. Pupils equal and reactive to light. Sclerae anicteric. No conjunctival injection. No oro-pharyngeal erythema.  NECK: Supple. There is no jugular venous distention. No bruits, no lymphadenopathy, no thyromegaly.  HEART: Regular rate and rhythm, tachycardic. No murmurs, no rubs, no clicks.  LUNGS:  Clear to auscultation bilaterally. No rales or rhonchi. No wheezes.  ABDOMEN: Soft, flat, nontender, nondistended. Has good bowel sounds. No hepatosplenomegaly appreciated.  EXTREMITIES: No evidence of any cyanosis, clubbing, or peripheral edema.  +2 pedal and radial pulses bilaterally.  NEUROLOGIC: The patient is alert, awake, and oriented x3 with no focal motor or sensory deficits appreciated bilaterally.  SKIN: Moist and warm with no rashes appreciated.  Psych: Not anxious, depressed LN: No inguinal LN enlargement    Antibiotics   Anti-infectives    Start     Dose/Rate Route Frequency Ordered Stop   02/06/15 0200  ciprofloxacin (CIPRO) IVPB 400 mg     400 mg 200 mL/hr over 60 Minutes Intravenous Every 12 hours 02/05/15 1439     02/05/15 2145  metroNIDAZOLE (FLAGYL) IVPB 500 mg     500 mg 100 mL/hr over 60 Minutes Intravenous Every 8 hours 02/05/15 1439     02/05/15 1230  ciprofloxacin (CIPRO) IVPB 400 mg     400 mg 200 mL/hr over 60 Minutes Intravenous  Once 02/05/15 1229 02/05/15 2352   02/05/15 1230  metroNIDAZOLE (FLAGYL) IVPB 500 mg     500 mg 100 mL/hr over 60 Minutes Intravenous  Once 02/05/15 1229 02/05/15 1342      Medications   Scheduled Meds: . buPROPion  150 mg Oral Daily  . ciprofloxacin  400 mg Intravenous Q12H  . lamoTRIgine  75 mg Oral Daily  . metoprolol tartrate  25 mg Oral BID  . metronidazole  500 mg  Intravenous Q8H  . pantoprazole  40 mg Oral Daily   Continuous Infusions: . sodium chloride 100 mL/hr at 02/06/15 1057   PRN Meds:.acetaminophen **OR** acetaminophen, morphine injection, ondansetron **OR** ondansetron (ZOFRAN) IV, oxyCODONE   Data Review:   Micro Results Recent Results (from the past 240 hour(s))  C difficile quick scan w PCR reflex ([facility] only)     Status: None   Collection Time: 02/05/15  3:50 PM  Result Value Ref Range Status   C Diff antigen NEGATIVE NEGATIVE Final   C Diff toxin NEGATIVE NEGATIVE Final   C Diff interpretation Negative for C. difficile  Final    Radiology Reports Ct Abdomen Pelvis W Contrast  02/05/2015   CLINICAL DATA:  Patient presents to the emergency department for diarrhea since yesterday and bright red bloody stools this morning. Patient also describes centralized abdominal pain above umbilicus.  EXAM: CT ABDOMEN AND PELVIS WITH CONTRAST  TECHNIQUE: Multidetector CT imaging of the abdomen and pelvis was performed using the standard protocol following bolus administration of intravenous contrast.  CONTRAST:  192mL OMNIPAQUE IOHEXOL 350 MG/ML SOLN  COMPARISON:  None.  FINDINGS: There is prominent thickening of the walls of the transverse colon and right colon. There is associated paracolic fluid stranding adjacent to the right colon. There is milder thickening of the walls of the descending colon. Rectosigmoid colon appears normal. No large bowel dilatation. Small bowel is normal in caliber and configuration. Stomach is unremarkable.  Liver, gallbladder, spleen, pancreas, adrenal glands, and kidneys appear normal. Abdominal aorta is normal in caliber and configuration. No circumscribed fluid collection or abscess collection identified in the abdomen or pelvis. No free intraperitoneal air. No pneumatosis intestinalis.  Lung bases are clear. Degenerative changes noted throughout the thoracolumbar spine, mild moderate in degree, but no acute osseous  abnormality.  IMPRESSION: Pronounced thickening of the walls of the ascending colon and transverse colon, with milder thickening of the walls of the descending colon, consistent with a  diffuse colitis of infectious or inflammatory nature. There is associated paracolic fluid stranding/inflammation. No paracolic abscess collection seen. No free intraperitoneal air. No associated bowel obstruction.  Remainder of the abdomen and pelvis CT examination is unremarkable, as detailed above.   Electronically Signed   By: Franki Cabot M.D.   On: 02/05/2015 11:53     CBC  Recent Labs Lab 02/05/15 0935 02/05/15 1453 02/05/15 2016 02/06/15 0432  WBC 13.7*  --   --  23.3*  HGB 16.6 15.8 16.8 17.0  HCT 49.4 46.7 49.1 50.2  PLT 227  --   --  192  MCV 92.4  --   --  91.5  MCH 31.1  --   --  31.1  MCHC 33.7  --   --  33.9  RDW 13.5  --   --  13.5    Chemistries   Recent Labs Lab 02/05/15 0935 02/06/15 0432  NA 134* 131*  K 4.1 3.4*  CL 98* 101  CO2 26 22  GLUCOSE 127* 138*  BUN 14 10  CREATININE 0.93 0.64  CALCIUM 8.8* 7.9*  AST 27 17  ALT 30 20  ALKPHOS 54 42  BILITOT 0.7 0.5   ------------------------------------------------------------------------------------------------------------------ estimated creatinine clearance is 116.5 mL/min (by C-G formula based on Cr of 0.64). ------------------------------------------------------------------------------------------------------------------ No results for input(s): HGBA1C in the last 72 hours. ------------------------------------------------------------------------------------------------------------------ No results for input(s): CHOL, HDL, LDLCALC, TRIG, CHOLHDL, LDLDIRECT in the last 72 hours. ------------------------------------------------------------------------------------------------------------------ No results for input(s): TSH, T4TOTAL, T3FREE, THYROIDAB in the last 72 hours.  Invalid input(s):  FREET3 ------------------------------------------------------------------------------------------------------------------ No results for input(s): VITAMINB12, FOLATE, FERRITIN, TIBC, IRON, RETICCTPCT in the last 72 hours.  Coagulation profile  Recent Labs Lab 02/05/15 0935 02/06/15 0432  INR 1.04 1.11    No results for input(s): DDIMER in the last 72 hours.  Cardiac Enzymes No results for input(s): CKMB, TROPONINI, MYOGLOBIN in the last 168 hours.  Invalid input(s): CK ------------------------------------------------------------------------------------------------------------------ Invalid input(s): [place] is a 54 y.o. male with a known history of GERD, bipolar disorder, hypertension and psoriasis is presenting to the ED with a chief complaint of midepigastric abdominal pain from yesterday morning associated with diarrhea with maroon-colored blood. CT abdomen has revealed diffuse colitis  Assessment and plan  #1 lower GI bleed secondary to diffuse colitis Continue empiric Cipro and Flagyl likely infectious in nature. I will await stool cultures coli and parasite evaluation patient does have history of recent travel to the [place] in the [place]. Appreciate GI input if does not improve may need sigmoidoscopy or colonoscopy  #2 bipolar disorder Continue Wellbutrin and Lamictal  #3 history of hypertension with elevated blood pressure Increase metoprolol dose he was not on any medications at home   Will provide GI prophylaxis with Protonix and DVT prophylaxis with SCDs     Code Status Orders        Start     Ordered   02/05/15 1439  Full code   Continuous     02/05/15 1439    Advance Directive Documentation        Most Recent Value   Type of Advance Directive  Living will   Pre-existing out of facility DNR order (yellow form or pink MOST form)     "MOST" Form in Place?             Consults  35 minutes   DVT  Prophylaxis sCDs    Lab Results  Component  Value Date   PLT 192 02/06/2015     Time Spent in minutes   57min  Greater than 50% of time spent in care coordination and counseling with patient and his wife   Dustin Flock M.D on 02/06/2015 at 12:22 PM  Between 7am to 6pm - Pager - [phone]  After 6pm go to [email or web address] - password [email or web address] [facility]   Office  [phone]

## 2015-02-07 LAB — CBC
HCT: 49.5 % (ref 40.0–52.0)
Hemoglobin: 16.8 g/dL (ref 13.0–18.0)
MCH: 31.1 pg (ref 26.0–34.0)
MCHC: 33.9 g/dL (ref 32.0–36.0)
MCV: 91.7 fL (ref 80.0–100.0)
Platelets: 167 10*3/uL (ref 150–440)
RBC: 5.4 MIL/uL (ref 4.40–5.90)
RDW: 13.7 % (ref 11.5–14.5)
WBC: 27.9 10*3/uL — AB (ref 3.8–10.6)

## 2015-02-07 LAB — WBCS, STOOL

## 2015-02-07 MED ORDER — DEXTROSE 5 % IV SOLN
500.0000 mg | INTRAVENOUS | Status: DC
  Administered 2015-02-07 – 2015-02-10 (×5): 500 mg via INTRAVENOUS
  Filled 2015-02-07 (×5): qty 500

## 2015-02-07 MED ORDER — POTASSIUM CHLORIDE IN NACL 20-0.9 MEQ/L-% IV SOLN
INTRAVENOUS | Status: DC
  Administered 2015-02-07 – 2015-02-08 (×4): via INTRAVENOUS
  Filled 2015-02-07 (×8): qty 1000

## 2015-02-07 NOTE — Progress Notes (Addendum)
Patient ID: Bruce Chambers., male   DOB: [birth year], 54 y.o.   MRN: [id number] GI Inpatient Follow-up Note  Patient Identification: Bruce Chambers. is a 54 y.o. male with bloody diarrhea. Covering for Dr. Vira Agar.   Subjective: Still having significant abdominal cramping, bloody diarrhea every 1-2 hours. WBC going up despite cipro/flagyl. Stool studies still pending. Feels weak. No fever. Interestingly, bloody diarrhea started 1 week after receiving humira for psoriasis. Pt also went to [place] for rafting trip earlier this month. No else got sick though.  Scheduled Inpatient Medications:  . buPROPion  150 mg Oral Daily  . ciprofloxacin  400 mg Intravenous Q12H  . lamoTRIgine  75 mg Oral Daily  . metoprolol tartrate  50 mg Oral BID  . metronidazole  500 mg Intravenous Q8H  . pantoprazole  40 mg Oral Daily    Continuous Inpatient Infusions:   . sodium chloride 100 mL/hr at 02/07/15 0048    PRN Inpatient Medications:  acetaminophen **OR** acetaminophen, hydrALAZINE, morphine injection, ondansetron **OR** ondansetron (ZOFRAN) IV, oxyCODONE  Review of Systems: Constitutional: Weight is stable.  Eyes: No changes in vision. ENT: No oral lesions, sore throat.  GI: see HPI.  Heme/Lymph: No easy bruising.  CV: No chest pain.  GU: No hematuria.  Integumentary: No rashes.  Neuro: No headaches.  Psych: No depression/anxiety.  Endocrine: No heat/cold intolerance.  Allergic/Immunologic: No urticaria.  Resp: No cough, SOB.  Musculoskeletal: No joint swelling.    Physical Examination: BP 147/84 mmHg  Pulse 78  Temp(Src) 98.6 F (37 C) (Oral)  Resp 16  Ht 6\' 1"  (1.854 m)  Wt 93.532 kg (206 lb 3.2 oz)  BMI 27.21 kg/m2  SpO2 95% Gen: NAD, alert and oriented x 4 HEENT: PEERLA, EOMI, Neck: supple, no JVD or thyromegaly Chest: CTA bilaterally, no wheezes, crackles, or other adventitious sounds CV: RRR, no m/g/c/r Abd: soft, NT, ND, +BS in all four quadrants; no HSM,  guarding, ridigity, or rebound tenderness Ext: no edema, well perfused with 2+ pulses, Skin: no rash or lesions noted Lymph: no LAD  Data: Lab Results  Component Value Date   WBC 27.9* 02/07/2015   HGB 16.8 02/07/2015   HCT 49.5 02/07/2015   MCV 91.7 02/07/2015   PLT 167 02/07/2015    Recent Labs Lab 02/05/15 2016 02/06/15 0432 02/07/15 0519  HGB 16.8 17.0 16.8   Lab Results  Component Value Date   NA 131* 02/06/2015   K 3.4* 02/06/2015   CL 101 02/06/2015   CO2 22 02/06/2015   BUN 10 02/06/2015   CREATININE 0.64 02/06/2015   Lab Results  Component Value Date   ALT 20 02/06/2015   AST 17 02/06/2015   ALKPHOS 42 02/06/2015   BILITOT 0.5 02/06/2015    Recent Labs Lab 02/06/15 0432  INR 1.11   Assessment/Plan: Bruce Chambers is a 54 y.o. male with bloody diarrhea. Clinically not improved so far with Abx.   Recommendations: Make sure pt is well hydrated. Continue cipro/flagyl. Await stool studies. May well have hemorrhagic E coli colitis. If clinically not improved and stool studies unrevealing, may need to schedule at flex sig with bx. Pt not hungry. Leery about advancing diet that may worsen his symptoms. So, continue clears for now. Will follow. thanks Please call with questions or concerns.  Bruce Chambers, Lupita Dawn, MD

## 2015-02-07 NOTE — Progress Notes (Addendum)
Result from lab of positive campylobacter. MD called and notified. IV cipro and flaygl DC. New order for Iv azithromycin. Isolation precautions intiated.

## 2015-02-07 NOTE — Progress Notes (Signed)
Patient ID: [name]., male   DOB: 05-11-1961, 54 y.o.   MRN: [id number] [facility] PROGRESS NOTE  PCP: Delight Stare  HPI/Subjective: Patient feeling very weak. At least 10 bowel movements overnight. 2 bowel movements this morning. All blood. Positive nausea. Positive for abdominal pain can be as high as 6 out of 10 intensity.  Objective: Filed Vitals:   02/07/15 0738  BP: 147/84  Pulse: 78  Temp: 98.6 F (37 C)  Resp: 16    Intake/Output Summary (Last 24 hours) at 02/07/15 1134 Last data filed at 02/07/15 0900  Gross per 24 hour  Intake   3632 ml  Output    525 ml  Net   3107 ml   Filed Weights   02/05/15 0837 02/05/15 1443  Weight: 90.719 kg (200 lb) 93.532 kg (206 lb 3.2 oz)    ROS: Review of Systems  Constitutional: Negative for fever and chills.  Eyes: Negative for blurred vision.  Respiratory: Negative for cough and shortness of breath.   Cardiovascular: Negative for chest pain.  Gastrointestinal: Positive for nausea, abdominal pain and diarrhea. Negative for vomiting and constipation.  Genitourinary: Negative for dysuria.  Musculoskeletal: Negative for joint pain.  Neurological: Negative for dizziness and headaches.   Exam: Physical Exam  Constitutional: He is oriented to person, place, and time.  HENT:  Nose: No mucosal edema.  Mouth/Throat: No oropharyngeal exudate or posterior oropharyngeal edema.  Eyes: Conjunctivae, EOM and lids are normal. Pupils are equal, round, and reactive to light.  Neck: No JVD present. Carotid bruit is not present. No edema present. No thyroid mass and no thyromegaly present.  Cardiovascular: S1 normal and S2 normal.  Exam reveals no gallop.   No murmur heard. Pulses:      Dorsalis pedis pulses are 2+ on the right side, and 2+ on the left side.  Respiratory: No respiratory distress. He has no wheezes. He has no rhonchi. He has no rales.  GI: Soft. Bowel sounds are normal. There is no tenderness.   Musculoskeletal:       Right ankle: He exhibits no swelling.       Left ankle: He exhibits no swelling.  Lymphadenopathy:    He has no cervical adenopathy.  Neurological: He is alert and oriented to person, place, and time. No cranial nerve deficit.  Skin: Skin is warm. No rash noted. Nails show no clubbing.  Psychiatric: He has a normal mood and affect.    Data Reviewed: Basic Metabolic Panel:  Recent Labs Lab 02/05/15 0935 02/06/15 0432  NA 134* 131*  K 4.1 3.4*  CL 98* 101  CO2 26 22  GLUCOSE 127* 138*  BUN 14 10  CREATININE 0.93 0.64  CALCIUM 8.8* 7.9*   Liver Function Tests:  Recent Labs Lab 02/05/15 0935 02/06/15 0432  AST 27 17  ALT 30 20  ALKPHOS 54 42  BILITOT 0.7 0.5  PROT 7.0 5.9*  ALBUMIN 4.1 3.2*    Recent Labs Lab 02/05/15 0935  LIPASE 11*   CBC:  Recent Labs Lab 02/05/15 0935 02/05/15 1453 02/05/15 2016 02/06/15 0432 02/07/15 0519  WBC 13.7*  --   --  23.3* 27.9*  HGB 16.6 15.8 16.8 17.0 16.8  HCT 49.4 46.7 49.1 50.2 49.5  MCV 92.4  --   --  91.5 91.7  PLT 227  --   --  192 167    Recent Results (from the past 240 hour(s))  C difficile quick scan w PCR reflex (  [facility] only)     Status: None   Collection Time: 02/05/15  3:50 PM  Result Value Ref Range Status   C Diff antigen NEGATIVE NEGATIVE Final   C Diff toxin NEGATIVE NEGATIVE Final   C Diff interpretation Negative for C. difficile  Final     Studies: Ct Abdomen Pelvis W Contrast  02/05/2015   CLINICAL DATA:  Patient presents to the emergency department for diarrhea since yesterday and bright red bloody stools this morning. Patient also describes centralized abdominal pain above umbilicus.  EXAM: CT ABDOMEN AND PELVIS WITH CONTRAST  TECHNIQUE: Multidetector CT imaging of the abdomen and pelvis was performed using the standard protocol following bolus administration of intravenous contrast.  CONTRAST:  15mL OMNIPAQUE IOHEXOL 350 MG/ML SOLN  COMPARISON:  None.  FINDINGS: There is  prominent thickening of the walls of the transverse colon and right colon. There is associated paracolic fluid stranding adjacent to the right colon. There is milder thickening of the walls of the descending colon. Rectosigmoid colon appears normal. No large bowel dilatation. Small bowel is normal in caliber and configuration. Stomach is unremarkable.  Liver, gallbladder, spleen, pancreas, adrenal glands, and kidneys appear normal. Abdominal aorta is normal in caliber and configuration. No circumscribed fluid collection or abscess collection identified in the abdomen or pelvis. No free intraperitoneal air. No pneumatosis intestinalis.  Lung bases are clear. Degenerative changes noted throughout the thoracolumbar spine, mild moderate in degree, but no acute osseous abnormality.  IMPRESSION: Pronounced thickening of the walls of the ascending colon and transverse colon, with milder thickening of the walls of the descending colon, consistent with a diffuse colitis of infectious or inflammatory nature. There is associated paracolic fluid stranding/inflammation. No paracolic abscess collection seen. No free intraperitoneal air. No associated bowel obstruction.  Remainder of the abdomen and pelvis CT examination is unremarkable, as detailed above.   Electronically Signed   By: Franki Cabot M.D.   On: 02/05/2015 11:53    Scheduled Meds: . buPROPion  150 mg Oral Daily  . ciprofloxacin  400 mg Intravenous Q12H  . lamoTRIgine  75 mg Oral Daily  . metoprolol tartrate  50 mg Oral BID  . metronidazole  500 mg Intravenous Q8H  . pantoprazole  40 mg Oral Daily   Continuous Infusions: . 0.9 % NaCl with KCl 20 mEq / L      Assessment/Plan:  1. Acute colitis in the immunocompromised patient, leukocytosis and abdominal pain. Patient empirically on IV Cipro and Flagyl. Continue IV fluid hydration and symptomatic management at this point. I spoke with the microbiology lab they are still working on the stool culture and  ova and parasites. Stool for C. difficile is negative. Patient did have a white water rafting trip this month out [place]. No recent antibiotics. 2. Hypokalemia- will put potassium in IV fluids 3. Essential hypertension continue usual medications if able to tolerate. 4. Bipolar disorder continue psychiatric medications.  Code Status:     Code Status Orders        Start     Ordered   02/05/15 1439  Full code   Continuous     02/05/15 1439    Advance Directive Documentation        Most Recent Value   Type of Advance Directive  Living will   Pre-existing out of facility DNR order (yellow form or pink MOST form)     "MOST" Form in Place?       Family Communication: Family at bedside Disposition Plan:  Home once better  Consultants:  Gastrointestinal  Time spent: 25 minutes  Loletha Grayer  [facility]

## 2015-02-08 ENCOUNTER — Encounter: Payer: Self-pay | Admitting: Gastroenterology

## 2015-02-08 LAB — MAGNESIUM: Magnesium: 1.8 mg/dL (ref 1.7–2.4)

## 2015-02-08 LAB — BASIC METABOLIC PANEL
Anion gap: 4 — ABNORMAL LOW (ref 5–15)
BUN: 13 mg/dL (ref 6–20)
CALCIUM: 7.7 mg/dL — AB (ref 8.9–10.3)
CO2: 29 mmol/L (ref 22–32)
CREATININE: 0.86 mg/dL (ref 0.61–1.24)
Chloride: 98 mmol/L — ABNORMAL LOW (ref 101–111)
GFR calc Af Amer: >60 mL/min (ref >60)
GFR calc non Af Amer: >60 mL/min (ref >60)
Glucose, Bld: 113 mg/dL — ABNORMAL HIGH (ref 65–99)
Potassium: 4.4 mmol/L (ref 3.5–5.1)
Sodium: 131 mmol/L — ABNORMAL LOW (ref 135–145)

## 2015-02-08 LAB — STOOL CULTURE

## 2015-02-08 LAB — CBC
HCT: 45.7 % (ref 40.0–52.0)
HEMOGLOBIN: 15.2 g/dL (ref 13.0–18.0)
MCH: 30.9 pg (ref 26.0–34.0)
MCHC: 33.2 g/dL (ref 32.0–36.0)
MCV: 93.1 fL (ref 80.0–100.0)
PLATELETS: 152 10*3/uL (ref 150–440)
RBC: 4.91 MIL/uL (ref 4.40–5.90)
RDW: 13.6 % (ref 11.5–14.5)
WBC: 26.1 10*3/uL — ABNORMAL HIGH (ref 3.8–10.6)

## 2015-02-08 MED ORDER — SODIUM CHLORIDE 0.9 % IV SOLN
INTRAVENOUS | Status: DC
  Administered 2015-02-08 – 2015-02-11 (×8): via INTRAVENOUS

## 2015-02-08 NOTE — Progress Notes (Signed)
Patient ID: Bruce Chambers., male   DOB: 07-07-1961, 54 y.o.   MRN: [id number] [facility] Phys[facility] PROGRESS NOTE  PCP: Delight Stare  HPI/Subjective: Patient still with abdominal bloating and pain. Still with bloody watery diarrhea. The overnight bowel movements had settled down. He has had 10 bowel movements since he saw me yesterday. He was sleeping one time and had a bowel movement. Some back pain with lying in the bed.  Objective: Filed Vitals:   02/08/15 0014  BP: 120/67  Pulse: 74  Temp: 98.4 F (36.9 C)  Resp: 16    Filed Weights   02/05/15 0837 02/05/15 1443  Weight: 90.719 kg (200 lb) 93.532 kg (206 lb 3.2 oz)    ROS: Review of Systems  Constitutional: Negative for fever and chills.  Eyes: Negative for blurred vision.  Respiratory: Negative for cough and shortness of breath.   Cardiovascular: Negative for chest pain.  Gastrointestinal: Positive for nausea, abdominal pain and diarrhea. Negative for vomiting and constipation.  Genitourinary: Negative for dysuria.  Musculoskeletal: Positive for back pain. Negative for joint pain.  Neurological: Negative for dizziness and headaches.   Exam: Physical Exam  Constitutional: He is oriented to person, place, and time.  HENT:  Nose: No mucosal edema.  Mouth/Throat: No oropharyngeal exudate or posterior oropharyngeal edema.  Eyes: Conjunctivae, EOM and lids are normal. Pupils are equal, round, and reactive to light.  Neck: No JVD present. Carotid bruit is not present. No edema present. No thyroid mass and no thyromegaly present.  Cardiovascular: S1 normal and S2 normal.  Exam reveals no gallop.   No murmur heard. Pulses:      Dorsalis pedis pulses are 2+ on the right side, and 2+ on the left side.  Respiratory: No respiratory distress. He has no wheezes. He has no rhonchi. He has no rales.  GI: Soft. Bowel sounds are normal. There is generalized tenderness.  Musculoskeletal:       Right ankle: He exhibits no  swelling.       Left ankle: He exhibits no swelling.  Lymphadenopathy:    He has no cervical adenopathy.  Neurological: He is alert and oriented to person, place, and time. No cranial nerve deficit.  Skin: Skin is warm. No rash noted. Nails show no clubbing.  Psychiatric: He has a normal mood and affect.    Data Reviewed: Basic Metabolic Panel:  Recent Labs Lab 02/05/15 0935 02/06/15 0432 02/08/15 0614  NA 134* 131* 131*  K 4.1 3.4* 4.4  CL 98* 101 98*  CO2 26 22 29   GLUCOSE 127* 138* 113*  BUN 14 10 13   CREATININE 0.93 0.64 0.86  CALCIUM 8.8* 7.9* 7.7*  MG  --   --  1.8   Liver Function Tests:  Recent Labs Lab 02/05/15 0935 02/06/15 0432  AST 27 17  ALT 30 20  ALKPHOS 54 42  BILITOT 0.7 0.5  PROT 7.0 5.9*  ALBUMIN 4.1 3.2*    Recent Labs Lab 02/05/15 0935  LIPASE 11*   CBC:  Recent Labs Lab 02/05/15 0935 02/05/15 1453 02/05/15 2016 02/06/15 0432 02/07/15 0519 02/08/15 0614  WBC 13.7*  --   --  23.3* 27.9* 26.1*  HGB 16.6 15.8 16.8 17.0 16.8 15.2  HCT 49.4 46.7 49.1 50.2 49.5 45.7  MCV 92.4  --   --  91.5 91.7 93.1  PLT 227  --   --  192 167 152    Recent Results (from the past 240 hour(s))  C difficile  quick scan w PCR reflex ([facility] only)     Status: None   Collection Time: 02/05/15  3:50 PM  Result Value Ref Range Status   C Diff antigen NEGATIVE NEGATIVE Final   C Diff toxin NEGATIVE NEGATIVE Final   C Diff interpretation Negative for C. difficile  Final  Stool culture     Status: None (Preliminary result)   Collection Time: 02/06/15 12:51 PM  Result Value Ref Range Status   Specimen Description STOOL  Final   Special Requests NONE  Final   Culture   Final    NO SALMONELLA OR SHIGELLA ISOLATED NO NORMAL FECAL FLORA PRESENT CAMPYLOBACTER DETECTED CRITICAL RESULT CALLED TO, READ BACK BY AND VERIFIED WITH: JOSH SIMSER,RN @1438  02/07/2015 BY JRS.    Report Status PENDING  Incomplete     Studies: No results found.  Scheduled Meds: .  azithromycin  500 mg Intravenous Q24H  . buPROPion  150 mg Oral Daily  . lamoTRIgine  75 mg Oral Daily  . metoprolol tartrate  50 mg Oral BID  . pantoprazole  40 mg Oral Daily   Continuous Infusions: . sodium chloride      Assessment/Plan:  1. Acute Campylobacter colitis in the immunocompromised patient, leukocytosis and abdominal pain. Patient empirically on IV Cipro and Flagyl from admission. These antibiotics were discontinued yesterday and patient put on IV Zithromax once the Campylobacter results came back. Patient had 10 bowel movements since he saw me yesterday. Still having blood and water diarrhea. White blood cell count still very high. 2. Hypokalemia- replaced. 3. Essential hypertension continue usual medications if able to tolerate. 4. Bipolar disorder continue psychiatric medications. 5. Hyponatremia- continue IV fluid hydration with normal saline.  Code Status:     Code Status Orders        Start     Ordered   02/05/15 1439  Full code   Continuous     02/05/15 1439    Advance Directive Documentation        Most Recent Value   Type of Advance Directive  Living will   Pre-existing out of facility DNR order (yellow form or pink MOST form)     "MOST" Form in Place?       Family Communication: Family at bedside Disposition Plan: Home once better  Consultants:  Gastrointestinal  Time spent: 25 minutes  Loletha Grayer  [facility]

## 2015-02-08 NOTE — Consult Note (Signed)
  GI Inpatient Follow-up Note  Patient Identification: Bruce Chambers. is a 54 y.o. male  Subjective: Stool positive for campylobacter. Abx regimen switched to azithromycin last night. Slight improvement. Still bloated. Bloody stool.  Scheduled Inpatient Medications:  . azithromycin  500 mg Intravenous Q24H  . buPROPion  150 mg Oral Daily  . lamoTRIgine  75 mg Oral Daily  . metoprolol tartrate  50 mg Oral BID  . pantoprazole  40 mg Oral Daily    Continuous Inpatient Infusions:   . sodium chloride      PRN Inpatient Medications:  acetaminophen **OR** acetaminophen, hydrALAZINE, morphine injection, ondansetron **OR** ondansetron (ZOFRAN) IV, oxyCODONE  Review of Systems: Constitutional: Weight is stable.  Eyes: No changes in vision. ENT: No oral lesions, sore throat.  GI: see HPI.  Heme/Lymph: No easy bruising.  CV: No chest pain.  GU: No hematuria.  Integumentary: No rashes.  Neuro: No headaches.  Psych: No depression/anxiety.  Endocrine: No heat/cold intolerance.  Allergic/Immunologic: No urticaria.  Resp: No cough, SOB.  Musculoskeletal: No joint swelling.    Physical Examination: BP 120/67 mmHg  Pulse 74  Temp(Src) 98.4 F (36.9 C) (Oral)  Resp 16  Ht 6\' 1"  (1.854 m)  Wt 93.532 kg (206 lb 3.2 oz)  BMI 27.21 kg/m2  SpO2 96% Gen: NAD, alert and oriented x 4 HEENT: PEERLA, EOMI, Neck: supple, no JVD or thyromegaly Chest: CTA bilaterally, no wheezes, crackles, or other adventitious sounds CV: RRR, no m/g/c/r Abd: soft, mild distension, mild tenderness, decreased BS in all four quadrants; no HSM, guarding, ridigity, or rebound tenderness Ext: no edema, well perfused with 2+ pulses, Skin: no rash or lesions noted Lymph: no LAD  Data: Lab Results  Component Value Date   WBC 26.1* 02/08/2015   HGB 15.2 02/08/2015   HCT 45.7 02/08/2015   MCV 93.1 02/08/2015   PLT 152 02/08/2015    Recent Labs Lab 02/06/15 0432 02/07/15 0519 02/08/15 0614  HGB  17.0 16.8 15.2   Lab Results  Component Value Date   NA 131* 02/08/2015   K 4.4 02/08/2015   CL 98* 02/08/2015   CO2 29 02/08/2015   BUN 13 02/08/2015   CREATININE 0.86 02/08/2015   Lab Results  Component Value Date   ALT 20 02/06/2015   AST 17 02/06/2015   ALKPHOS 42 02/06/2015   BILITOT 0.5 02/06/2015    Recent Labs Lab 02/06/15 0432  INR 1.11   Assessment/Plan: Bruce Chambers is a 54 y.o. male with campylobacter with bloody diarrhea.  Recommendations: Gradually advance diet as tolerated. Continue IV hydration for now. Hopefully, clinical improvement with macrolides. No need for flex sig at this time. Dr. Vira Agar to see pt tomorrow. thanks Please call with questions or concerns.  Danie Diehl, Lupita Dawn, MD

## 2015-02-09 ENCOUNTER — Inpatient Hospital Stay: Payer: BC Managed Care – PPO

## 2015-02-09 NOTE — Progress Notes (Signed)
Pt concerned over increasing abdominal bloating and overall lack of improvement. MD called and informed of situation and pt feelings. MD ordered an XRAY and stated Dr. Tiffany Kocher would begin rounding on him again.

## 2015-02-09 NOTE — Progress Notes (Signed)
Patient ID: Bruce Chambers., male   DOB: 03-31-1961, 54 y.o.   MRN: [id number] [facility] PROGRESS NOTE  PCP: Delight Stare  HPI/Subjective: Patient's major complaint is abdominal bloating and pain. Still having diarrhea but now the consistency is more like grits. Some nausea. As per patient had 6-8 bowel movements since I saw him yesterday. As per the wife he has had 8-10 bowel movements.  Objective: Filed Vitals:   02/09/15 0818  BP: 127/91  Pulse: 105  Temp: 98.7 F (37.1 C)  Resp: 18    Filed Weights   02/05/15 0837 02/05/15 1443  Weight: 90.719 kg (200 lb) 93.532 kg (206 lb 3.2 oz)    ROS: Review of Systems  Constitutional: Negative for fever and chills.  Eyes: Negative for blurred vision.  Respiratory: Negative for cough and shortness of breath.   Cardiovascular: Negative for chest pain.  Gastrointestinal: Positive for nausea, abdominal pain and diarrhea. Negative for vomiting and constipation.  Genitourinary: Negative for dysuria.  Musculoskeletal: Positive for back pain. Negative for joint pain.  Neurological: Negative for dizziness and headaches.   Exam: Physical Exam  Constitutional: He is oriented to person, place, and time.  HENT:  Nose: No mucosal edema.  Mouth/Throat: No oropharyngeal exudate or posterior oropharyngeal edema.  Eyes: Conjunctivae, EOM and lids are normal. Pupils are equal, round, and reactive to light.  Neck: No JVD present. Carotid bruit is not present. No edema present. No thyroid mass and no thyromegaly present.  Cardiovascular: S1 normal and S2 normal.  Exam reveals no gallop.   No murmur heard. Pulses:      Dorsalis pedis pulses are 2+ on the right side, and 2+ on the left side.  Respiratory: No respiratory distress. He has no wheezes. He has no rhonchi. He has no rales.  GI: Soft. Bowel sounds are normal. There is generalized tenderness.  Musculoskeletal:       Right ankle: He exhibits no swelling.       Left  ankle: He exhibits no swelling.  Lymphadenopathy:    He has no cervical adenopathy.  Neurological: He is alert and oriented to person, place, and time. No cranial nerve deficit.  Skin: Skin is warm. No rash noted. Nails show no clubbing.  Psychiatric: He has a normal mood and affect.    Data Reviewed: Basic Metabolic Panel:  Recent Labs Lab 02/05/15 0935 02/06/15 0432 02/08/15 0614  NA 134* 131* 131*  K 4.1 3.4* 4.4  CL 98* 101 98*  CO2 26 22 29   GLUCOSE 127* 138* 113*  BUN 14 10 13   CREATININE 0.93 0.64 0.86  CALCIUM 8.8* 7.9* 7.7*  MG  --   --  1.8   Liver Function Tests:  Recent Labs Lab 02/05/15 0935 02/06/15 0432  AST 27 17  ALT 30 20  ALKPHOS 54 42  BILITOT 0.7 0.5  PROT 7.0 5.9*  ALBUMIN 4.1 3.2*    Recent Labs Lab 02/05/15 0935  LIPASE 11*   CBC:  Recent Labs Lab 02/05/15 0935 02/05/15 1453 02/05/15 2016 02/06/15 0432 02/07/15 0519 02/08/15 0614  WBC 13.7*  --   --  23.3* 27.9* 26.1*  HGB 16.6 15.8 16.8 17.0 16.8 15.2  HCT 49.4 46.7 49.1 50.2 49.5 45.7  MCV 92.4  --   --  91.5 91.7 93.1  PLT 227  --   --  192 167 152    Recent Results (from the past 240 hour(s))  C difficile quick scan w PCR reflex (  [facility] only)     Status: None   Collection Time: 02/05/15  3:50 PM  Result Value Ref Range Status   C Diff antigen NEGATIVE NEGATIVE Final   C Diff toxin NEGATIVE NEGATIVE Final   C Diff interpretation Negative for C. difficile  Final  Stool culture     Status: None   Collection Time: 02/06/15 12:51 PM  Result Value Ref Range Status   Specimen Description STOOL  Final   Special Requests NONE  Final   Culture   Final    NO SALMONELLA OR SHIGELLA ISOLATED NO NORMAL FECAL FLORA PRESENT CAMPYLOBACTER DETECTED CRITICAL RESULT CALLED TO, READ BACK BY AND VERIFIED WITH: JOSH SIMSER,RN @1438  02/07/2015 BY JRS. No Pathogenic E. coli detected    Report Status 02/08/2015 FINAL  Final     Studies: No results found.  Scheduled Meds: .  azithromycin  500 mg Intravenous Q24H  . buPROPion  150 mg Oral Daily  . lamoTRIgine  75 mg Oral Daily  . metoprolol tartrate  50 mg Oral BID  . pantoprazole  40 mg Oral Daily   Continuous Infusions: . sodium chloride 150 mL/hr at 02/09/15 1007    Assessment/Plan:  1. Acute Campylobacter colitis in the immunocompromised patient, leukocytosis and abdominal pain. Continue IV Zithromax. Patient had 6-8 bowel movements since he saw me yesterday. 2. Hypokalemia- replaced. 3. Essential hypertension continue usual medications if able to tolerate. 4. Bipolar disorder continue psychiatric medications. 5. Hyponatremia- continue IV fluid hydration with normal saline.  Code Status:     Code Status Orders        Start     Ordered   02/05/15 1439  Full code   Continuous     02/05/15 1439    Advance Directive Documentation        Most Recent Value   Type of Advance Directive  Living will   Pre-existing out of facility DNR order (yellow form or pink MOST form)     "MOST" Form in Place?       Family Communication: Family at bedside Disposition Plan: Home once better  Consultants:  Gastrointestinal  Time spent: 20 minutes  [place], [place] [facility]

## 2015-02-09 NOTE — Consult Note (Signed)
Pt with Campylobacter infection with no further bleeding, he has cramps and bloating but bowel sounds good, x-ray per patient was ok today.  He has started to eat real food and it may be too early.  I told him he was turning the corner and should expect slow progress in next 2 days and to eat as little as possible for his colon to have less to process and drink more fluids if possible.  Repeat CBC in morning.

## 2015-02-10 LAB — COMPREHENSIVE METABOLIC PANEL
ALT: 12 U/L — AB (ref 17–63)
AST: 13 U/L — ABNORMAL LOW (ref 15–41)
Albumin: 2.1 g/dL — ABNORMAL LOW (ref 3.5–5.0)
Alkaline Phosphatase: 52 U/L (ref 38–126)
Anion gap: 6 (ref 5–15)
BILIRUBIN TOTAL: 0.6 mg/dL (ref 0.3–1.2)
BUN: 9 mg/dL (ref 6–20)
CALCIUM: 7.4 mg/dL — AB (ref 8.9–10.3)
CO2: 28 mmol/L (ref 22–32)
Chloride: 99 mmol/L — ABNORMAL LOW (ref 101–111)
Creatinine, Ser: 0.82 mg/dL (ref 0.61–1.24)
GFR calc Af Amer: >60 mL/min (ref >60)
GFR calc non Af Amer: >60 mL/min (ref >60)
Glucose, Bld: 99 mg/dL (ref 65–99)
Potassium: 3.5 mmol/L (ref 3.5–5.1)
Sodium: 133 mmol/L — ABNORMAL LOW (ref 135–145)
Total Protein: 4.5 g/dL — ABNORMAL LOW (ref 6.5–8.1)

## 2015-02-10 LAB — CBC
HEMATOCRIT: 37.7 % — AB (ref 40.0–52.0)
Hemoglobin: 12.8 g/dL — ABNORMAL LOW (ref 13.0–18.0)
MCH: 31.3 pg (ref 26.0–34.0)
MCHC: 34 g/dL (ref 32.0–36.0)
MCV: 91.9 fL (ref 80.0–100.0)
PLATELETS: 180 10*3/uL (ref 150–440)
RBC: 4.1 MIL/uL — AB (ref 4.40–5.90)
RDW: 13.6 % (ref 11.5–14.5)
WBC: 19.4 10*3/uL — AB (ref 3.8–10.6)

## 2015-02-10 MED ORDER — POTASSIUM CHLORIDE CRYS ER 20 MEQ PO TBCR
20.0000 meq | EXTENDED_RELEASE_TABLET | Freq: Two times a day (BID) | ORAL | Status: DC
  Administered 2015-02-10 (×2): 20 meq via ORAL
  Filled 2015-02-10 (×2): qty 1

## 2015-02-10 NOTE — Care Management (Signed)
Case discussed at [facility] today.  May discharge in AM if stable per MD notes. No needs identified.

## 2015-02-10 NOTE — Progress Notes (Addendum)
Pt isolation precautions d/c.  Per Bruce Chambers pt requires standard precautions at this time as pt is not incontinent of bowel/bladder.    Note:  For campylobacter if a pt is incontinent of bowel/bladder the pt should be placed on enteric precautions.  If a pt is continent of bowel/bladder the pt should be placed on standard precautions only.

## 2015-02-10 NOTE — Progress Notes (Signed)
Patient ID: Bruce Chambers., male   DOB: 09/20/1960, 54 y.o.   MRN: [id number] [facility] PROGRESS NOTE  PCP: Delight Stare  HPI/Subjective: Patient starting to feel little bit better today. Still had 11 bowel movements since I saw him yesterday. Still with abdominal bloating. He is hoping to go home tomorrow.   Objective: Filed Vitals:   02/10/15 0823  BP: 136/79  Pulse: 77  Temp: 98.4 F (36.9 C)  Resp:     Filed Weights   02/05/15 0837 02/05/15 1443  Weight: 90.719 kg (200 lb) 93.532 kg (206 lb 3.2 oz)    ROS: Review of Systems  Constitutional: Negative for fever and chills.  Eyes: Negative for blurred vision.  Respiratory: Negative for cough and shortness of breath.   Cardiovascular: Negative for chest pain.  Gastrointestinal: Positive for abdominal pain and diarrhea. Negative for nausea, vomiting and constipation.  Genitourinary: Negative for dysuria.  Musculoskeletal: Positive for back pain. Negative for joint pain.  Neurological: Negative for dizziness and headaches.   Exam: Physical Exam  Constitutional: He is oriented to person, place, and time.  HENT:  Nose: No mucosal edema.  Mouth/Throat: No oropharyngeal exudate or posterior oropharyngeal edema.  Eyes: Conjunctivae, EOM and lids are normal. Pupils are equal, round, and reactive to light.  Neck: No JVD present. Carotid bruit is not present. No edema present. No thyroid mass and no thyromegaly present.  Cardiovascular: S1 normal and S2 normal.  Exam reveals no gallop.   No murmur heard. Pulses:      Dorsalis pedis pulses are 2+ on the right side, and 2+ on the left side.  Respiratory: No respiratory distress. He has no wheezes. He has no rhonchi. He has no rales.  GI: Soft. Bowel sounds are normal. There is generalized tenderness.  Musculoskeletal:       Right ankle: He exhibits no swelling.       Left ankle: He exhibits no swelling.  Lymphadenopathy:    He has no cervical adenopathy.   Neurological: He is alert and oriented to person, place, and time. No cranial nerve deficit.  Skin: Skin is warm. No rash noted. Nails show no clubbing.  Psychiatric: He has a normal mood and affect.    Data Reviewed: Basic Metabolic Panel:  Recent Labs Lab 02/05/15 0935 02/06/15 0432 02/08/15 0614 02/10/15 0637  NA 134* 131* 131* 133*  K 4.1 3.4* 4.4 3.5  CL 98* 101 98* 99*  CO2 26 22 29 28   GLUCOSE 127* 138* 113* 99  BUN 14 10 13 9   CREATININE 0.93 0.64 0.86 0.82  CALCIUM 8.8* 7.9* 7.7* 7.4*  MG  --   --  1.8  --    Liver Function Tests:  Recent Labs Lab 02/05/15 0935 02/06/15 0432 02/10/15 0637  AST 27 17 13*  ALT 30 20 12*  ALKPHOS 54 42 52  BILITOT 0.7 0.5 0.6  PROT 7.0 5.9* 4.5*  ALBUMIN 4.1 3.2* 2.1*    Recent Labs Lab 02/05/15 0935  LIPASE 11*   CBC:  Recent Labs Lab 02/05/15 0935  02/05/15 2016 02/06/15 0432 02/07/15 0519 02/08/15 0614 02/10/15 0637  WBC 13.7*  --   --  23.3* 27.9* 26.1* 19.4*  HGB 16.6  < > 16.8 17.0 16.8 15.2 12.8*  HCT 49.4  < > 49.1 50.2 49.5 45.7 37.7*  MCV 92.4  --   --  91.5 91.7 93.1 91.9  PLT 227  --   --  192 167 152 180  < > =  values in this interval not displayed.  Recent Results (from the past 240 hour(s))  C difficile quick scan w PCR reflex ([facility] only)     Status: None   Collection Time: 02/05/15  3:50 PM  Result Value Ref Range Status   C Diff antigen NEGATIVE NEGATIVE Final   C Diff toxin NEGATIVE NEGATIVE Final   C Diff interpretation Negative for C. difficile  Final  Stool culture     Status: None   Collection Time: 02/06/15 12:51 PM  Result Value Ref Range Status   Specimen Description STOOL  Final   Special Requests NONE  Final   Culture   Final    NO SALMONELLA OR SHIGELLA ISOLATED NO NORMAL FECAL FLORA PRESENT CAMPYLOBACTER DETECTED CRITICAL RESULT CALLED TO, READ BACK BY AND VERIFIED WITH: JOSH SIMSER,RN @1438  02/07/2015 BY JRS. No Pathogenic E. coli detected    Report Status 02/08/2015  FINAL  Final     Studies: Dg Abd 2 Views  02/09/2015   CLINICAL DATA:  Abdominal bloating.  EXAM: ABDOMEN - 2 VIEW  COMPARISON:  CT scan of February 05, 2015.  FINDINGS: The bowel gas pattern is normal. There is no evidence of free air. No renal calculi are noted. Phleboliths are seen in the pelvis.  IMPRESSION: No evidence of bowel obstruction or ileus.   Electronically Signed   By: Marijo Conception, M.D.   On: 02/09/2015 14:29    Scheduled Meds: . azithromycin  500 mg Intravenous Q24H  . buPROPion  150 mg Oral Daily  . lamoTRIgine  75 mg Oral Daily  . metoprolol tartrate  50 mg Oral BID  . pantoprazole  40 mg Oral Daily  . potassium chloride  20 mEq Oral BID   Continuous Infusions: . sodium chloride 150 mL/hr at 02/10/15 0644    Assessment/Plan:  1. Acute Campylobacter colitis in the immunocompromised patient, leukocytosis and abdominal pain. Continue IV Zithromax. Patient had 11 bowel movements since he saw me yesterday. Continue supportive care. I would like to see bowel movements slowed down before discharge home 2. Hypokalemia- replace orally 3. Essential hypertension continue usual medications. 4. Bipolar disorder continue psychiatric medications. 5. Hyponatremia-  IV fluid hydration with normal saline.  Code Status:     Code Status Orders        Start     Ordered   02/05/15 1439  Full code   Continuous     02/05/15 1439    Advance Directive Documentation        Most Recent Value   Type of Advance Directive  Living will   Pre-existing out of facility DNR order (yellow form or pink MOST form)     "MOST" Form in Place?       Family Communication: Family at bedside Disposition Plan: Home once better  Consultants:  Gastrointestinal  Time spent: 20 minutes  [place] [place] [facility]

## 2015-02-10 NOTE — Plan of Care (Signed)
Problem: Phase I Progression Outcomes Goal: Other Phase I Outcomes/Goals Outcome: Not Applicable Date Met:  14/44/58 No additional Phase Outcome/Goals identified at this time.     Problem: Phase II Progression Outcomes Goal: Other Phase II Outcomes/Goals Outcome: Not Applicable Date Met:  48/35/07 No additional Phase Outcome/Goals identified at this time.  Problem: Phase III Progression Outcomes Goal: Other Phase III Outcomes/Goals Outcome: Completed/Met Date Met:  02/10/15 No additional Phase Outcome/Goals identified at this time.

## 2015-02-10 NOTE — Consult Note (Signed)
Pt with somewhat distended abdomen and discomfort.  Most in RLQ.  Several bowel movements today about every 1-1 1/2 hours in between.  No vomiting, no fever, no further bleeding.  Abd exam shows bowel sounds somewhat diminished but present, mild diffuse tenderness to finger percussion, worse in RLQ. WBC down to 19.4 from 28,May be able to go home tomorrow with close follow up with me.

## 2015-02-11 LAB — BASIC METABOLIC PANEL
ANION GAP: 8 (ref 5–15)
BUN: 9 mg/dL (ref 6–20)
CALCIUM: 7.6 mg/dL — AB (ref 8.9–10.3)
CO2: 25 mmol/L (ref 22–32)
Chloride: 103 mmol/L (ref 101–111)
Creatinine, Ser: 0.72 mg/dL (ref 0.61–1.24)
GFR calc Af Amer: >60 mL/min (ref >60)
GFR calc non Af Amer: >60 mL/min (ref >60)
GLUCOSE: 89 mg/dL (ref 65–99)
Potassium: 3.4 mmol/L — ABNORMAL LOW (ref 3.5–5.1)
Sodium: 136 mmol/L (ref 135–145)

## 2015-02-11 LAB — GIARDIA, EIA; OVA/PARASITE: Giardia Ag, Stl: NEGATIVE

## 2015-02-11 LAB — CBC
HCT: 36.2 % — ABNORMAL LOW (ref 40.0–52.0)
HEMOGLOBIN: 12.4 g/dL — AB (ref 13.0–18.0)
MCH: 31.1 pg (ref 26.0–34.0)
MCHC: 34.2 g/dL (ref 32.0–36.0)
MCV: 91.2 fL (ref 80.0–100.0)
Platelets: 214 10*3/uL (ref 150–440)
RBC: 3.97 MIL/uL — AB (ref 4.40–5.90)
RDW: 13.6 % (ref 11.5–14.5)
WBC: 17.5 10*3/uL — ABNORMAL HIGH (ref 3.8–10.6)

## 2015-02-11 LAB — O&P RESULT

## 2015-02-11 MED ORDER — AZITHROMYCIN 250 MG PO TABS: 500.0000 mg | ORAL_TABLET | Freq: Every day | ORAL | Status: DC

## 2015-02-11 MED ORDER — POTASSIUM CHLORIDE CRYS ER 20 MEQ PO TBCR: 20.0000 meq | EXTENDED_RELEASE_TABLET | Freq: Every day | ORAL | Status: DC

## 2015-02-11 MED ORDER — AZITHROMYCIN 250 MG PO TABS
ORAL_TABLET | ORAL | Status: DC
Start: 2015-02-11 — End: 2018-03-06

## 2015-02-11 MED ORDER — POTASSIUM CHLORIDE CRYS ER 20 MEQ PO TBCR
20.0000 meq | EXTENDED_RELEASE_TABLET | Freq: Every day | ORAL | Status: DC
  Administered 2015-02-11: 20 meq via ORAL
  Filled 2015-02-11: qty 1

## 2015-02-11 MED ORDER — METOPROLOL TARTRATE 25 MG PO TABS: 25.0000 mg | ORAL_TABLET | Freq: Two times a day (BID) | ORAL | Status: DC

## 2015-02-11 NOTE — Discharge Summary (Signed)
[facility] [place] NAME: Bruce Chambers    MR#:  [id number]  DATE OF BIRTH:  March 10, 1961  DATE OF ADMISSION:  02/05/2015 ADMITTING PHYSICIAN: Nicholes Mango, MD  DATE OF DISCHARGE: 02/11/2015  PRIMARY CARE PHYSICIAN: Dr. Kary Kos at [facility]   ADMISSION DIAGNOSIS:  Acute abdominal pain [R10.0] Colitis [K52.9] Gastrointestinal hemorrhage, unspecified gastritis, unspecified gastrointestinal hemorrhage type [K92.2]  DISCHARGE DIAGNOSIS:  Active Problems:   Acute lower GI bleeding   SECONDARY DIAGNOSIS:   Past Medical History  Diagnosis Date  . Hypertension   . Bipolar 1 disorder   . GERD (gastroesophageal reflux disease)   . Psoriasis     HOSPITAL COURSE:   1. Acute Campylobacter colitis and enteritis. Patient presented with bloody diarrhea. The patient was initially put on Cipro and Flagyl empirically. When the Campylobacter came back positive, the patient was switched over to Zithromax. The patient had quite a bit of diarrhea during the entire hospital course. Even upon discharge, the patient still having diarrhea. The patient states that quite a few of the times that he had to go the bathroom he just went because he was in the bathroom. Not a lot of diarrhea came out on those episodes. The patient wants to go home today even though he did have a 12 bowel movement since I saw him yesterday. But quite a few those bowel movements was very little and quantity. Dr. Vira Agar gastroenterology will follow-up as outpatient. I will prescribe Zithromax 500 mg orally for another 5 days. Patient must stay hydrated upon discharge. Hemoglobin remained stable despite hydration. 2. Essential hypertension- prescribe metoprolol 25 mg twice a day upon discharge. 3. Hypokalemia- replace potassium orally upon discharge for another 7 days. 4. Bipolar disorder- continue psychiatric medications. 5. Psoriasis- stop immunosuppressive injections while  sick.  DISCHARGE CONDITIONS:   Satisfactory  CONSULTS OBTAINED:  Treatment Team:  Nicholes Mango, MD Manya Silvas, MD  DRUG ALLERGIES:  No Known Allergies  DISCHARGE MEDICATIONS:   Current Discharge Medication List    START taking these medications   Details  azithromycin (ZITHROMAX) 250 MG tablet 2 tablets daily for five days Qty: 10 each, Refills: 0    metoprolol tartrate (LOPRESSOR) 25 MG tablet Take 1 tablet (25 mg total) by mouth 2 (two) times daily. Qty: 60 tablet, Refills: 0    potassium chloride SA (K-DUR,KLOR-CON) 20 MEQ tablet Take 1 tablet (20 mEq total) by mouth daily. Qty: 7 tablet, Refills: 0      CONTINUE these medications which have NOT CHANGED   Details  buPROPion (WELLBUTRIN XL) 150 MG 24 hr tablet Take 1 tablet by mouth daily.    lamoTRIgine (LAMICTAL) 150 MG tablet Take 75 mg by mouth daily.      STOP taking these medications     Adalimumab 40 MG/0.8ML PNKT          DISCHARGE INSTRUCTIONS:   Follow-up with Dr. Vira Agar around 5 days Follow-up with Dr. Kary Kos one week.  If you experience worsening of your admission symptoms, develop shortness of breath, life threatening emergency, suicidal or homicidal thoughts you must seek medical attention immediately by calling 911 or calling your MD immediately  if symptoms less severe.  You Must read complete instructions/literature along with all the possible adverse reactions/side effects for all the Medicines you take and that have been prescribed to you. Take any new Medicines after you have completely understood and accept all the possible adverse reactions/side effects.   Please  note  You were cared for by a hospitalist during your hospital stay. If you have any questions about your discharge medications or the care you received while you were in the hospital after you are discharged, you can call the unit and asked to speak with the hospitalist on call if the hospitalist that took care of you is  not available. Once you are discharged, your primary care physician will handle any further medical issues. Please note that NO REFILLS for any discharge medications will be authorized once you are discharged, as it is imperative that you return to your primary care physician (or establish a relationship with a primary care physician if you do not have one) for your aftercare needs so that they can reassess your need for medications and monitor your lab values.    Today   CHIEF COMPLAINT:   Chief Complaint  Patient presents with  . Diarrhea  . Blood In Stools    HISTORY OF PRESENT ILLNESS:  Bruce Chambers  is a 54 y.o. male presented with bloody diarrhea. Was admitted to the hospital. Empirically treat started treatment with Cipro and Flagyl.   VITAL SIGNS:  Blood pressure 114/57, pulse 84, temperature 99.3 F (37.4 C), temperature source Oral, resp. rate 16, height 6\' 1"  (1.854 m), weight 93.532 kg (206 lb 3.2 oz), SpO2 96 %.  I/O:   Intake/Output Summary (Last 24 hours) at 02/11/15 0748 Last data filed at 02/11/15 0457  Gross per 24 hour  Intake   2291 ml  Output      0 ml  Net   2291 ml    PHYSICAL EXAMINATION:  GENERAL:  54 y.o.-year-old patient lying in the bed with no acute distress.  EYES: Pupils equal, round, reactive to light and accommodation. No scleral icterus. Extraocular muscles intact.  HEENT: Head atraumatic, normocephalic. Oropharynx and nasopharynx clear.  NECK:  Supple, no jugular venous distention. No thyroid enlargement, no tenderness.  LUNGS: Normal breath sounds bilaterally, no wheezing, rales,rhonchi or crepitation. No use of accessory muscles of respiration.  CARDIOVASCULAR: S1, S2 normal. No murmurs, rubs, or gallops.  ABDOMEN: Soft, non-tender, non-distended. Bowel sounds present. No organomegaly or mass.  EXTREMITIES: No pedal edema, cyanosis, or clubbing.  NEUROLOGIC: Cranial nerves II through XII are intact. Muscle strength 5/5 in all extremities.  Sensation intact. Gait not checked.  PSYCHIATRIC: The patient is alert and oriented x 3.  SKIN: No obvious rash, lesion, or ulcer.   DATA REVIEW:   CBC  Recent Labs Lab 02/11/15 0448  WBC 17.5*  HGB 12.4*  HCT 36.2*  PLT 214    Chemistries   Recent Labs Lab 02/08/15 0614 02/10/15 0637 02/11/15 0448  NA 131* 133* 136  K 4.4 3.5 3.4*  CL 98* 99* 103  CO2 29 28 25   GLUCOSE 113* 99 89  BUN 13 9 9   CREATININE 0.86 0.82 0.72  CALCIUM 7.7* 7.4* 7.6*  MG 1.8  --   --   AST  --  13*  --   ALT  --  12*  --   ALKPHOS  --  52  --   BILITOT  --  0.6  --      Microbiology Results  Results for orders placed or performed during the hospital encounter of 02/05/15  C difficile quick scan w PCR reflex ([place] only)     Status: None   Collection Time: 02/05/15  3:50 PM  Result Value Ref Range Status   C Diff antigen NEGATIVE NEGATIVE  Final   C Diff toxin NEGATIVE NEGATIVE Final   C Diff interpretation Negative for C. difficile  Final  Stool culture     Status: None   Collection Time: 02/06/15 12:51 PM  Result Value Ref Range Status   Specimen Description STOOL  Final   Special Requests NONE  Final   Culture   Final    NO SALMONELLA OR SHIGELLA ISOLATED NO NORMAL FECAL FLORA PRESENT CAMPYLOBACTER DETECTED CRITICAL RESULT CALLED TO, READ BACK BY AND VERIFIED WITH: JOSH SIMSER,RN @1438  02/07/2015 BY JRS. No Pathogenic E. coli detected    Report Status 02/08/2015 FINAL  Final    RADIOLOGY:  Dg Abd 2 Views  02/09/2015   CLINICAL DATA:  Abdominal bloating.  EXAM: ABDOMEN - 2 VIEW  COMPARISON:  CT scan of February 05, 2015.  FINDINGS: The bowel gas pattern is normal. There is no evidence of free air. No renal calculi are noted. Phleboliths are seen in the pelvis.  IMPRESSION: No evidence of bowel obstruction or ileus.   Electronically Signed   By: Marijo Conception, M.D.   On: 02/09/2015 14:29   Management plans discussed with the patient, family and they are in agreement.  CODE  STATUS:     Code Status Orders        Start     Ordered   02/05/15 1439  Full code   Continuous     02/05/15 1439    Advance Directive Documentation        Most Recent Value   Type of Advance Directive  Living will   Pre-existing out of facility DNR order (yellow form or pink MOST form)     "MOST" Form in Place?        TOTAL TIME TAKING CARE OF THIS PATIENT: 40 minutes.    Loletha Grayer M.D on 02/11/2015 at 7:48 AM  Between 7am to 6pm - Pager - [phone]  After 6pm go to [email or web address] - password [email or web address] [facility]  Office  [phone]  CC: Primary care physician; Dr. [facility] [place] Gastroenterologist; Dr. Gaylyn Cheers

## 2015-11-25 IMAGING — CR DG ABDOMEN 2V
1 series · 3 of 3 positions shown · non-contrast
Comparison: CT scan of February 05, 2015.

CLINICAL DATA: Abdominal bloating.

EXAM:
ABDOMEN - 2 VIEW

[Series 1: dg abd 2 views · 0.14mm/px · 3 of 3 slices shown]
[im 1/3]
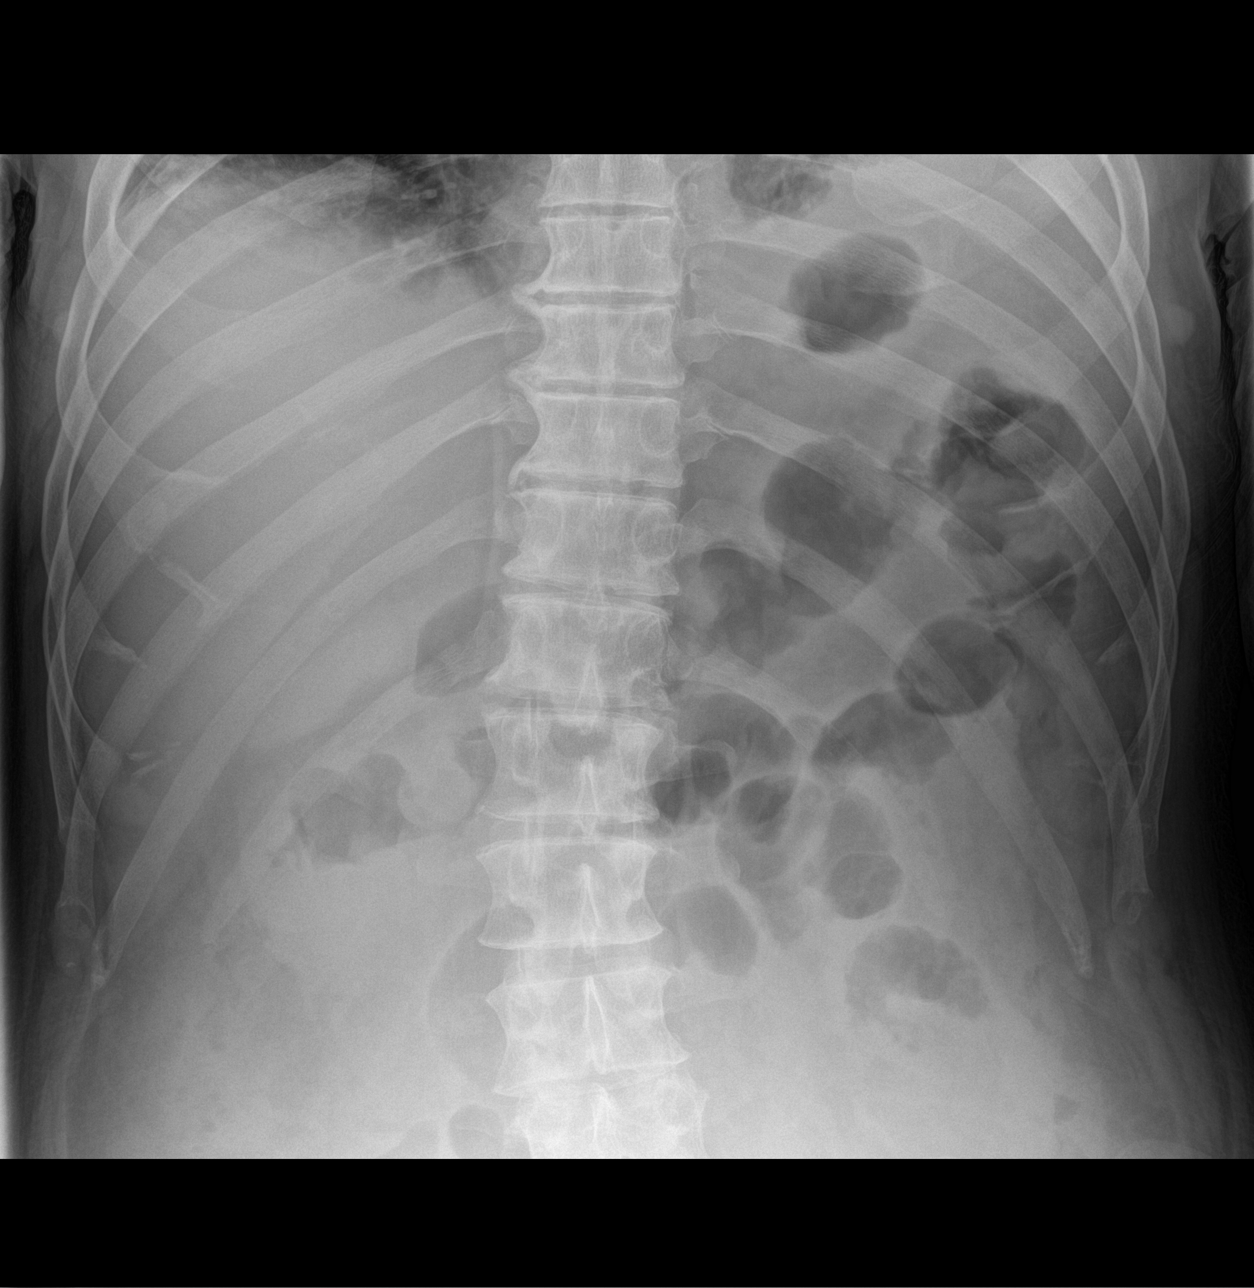
[im 2/3]
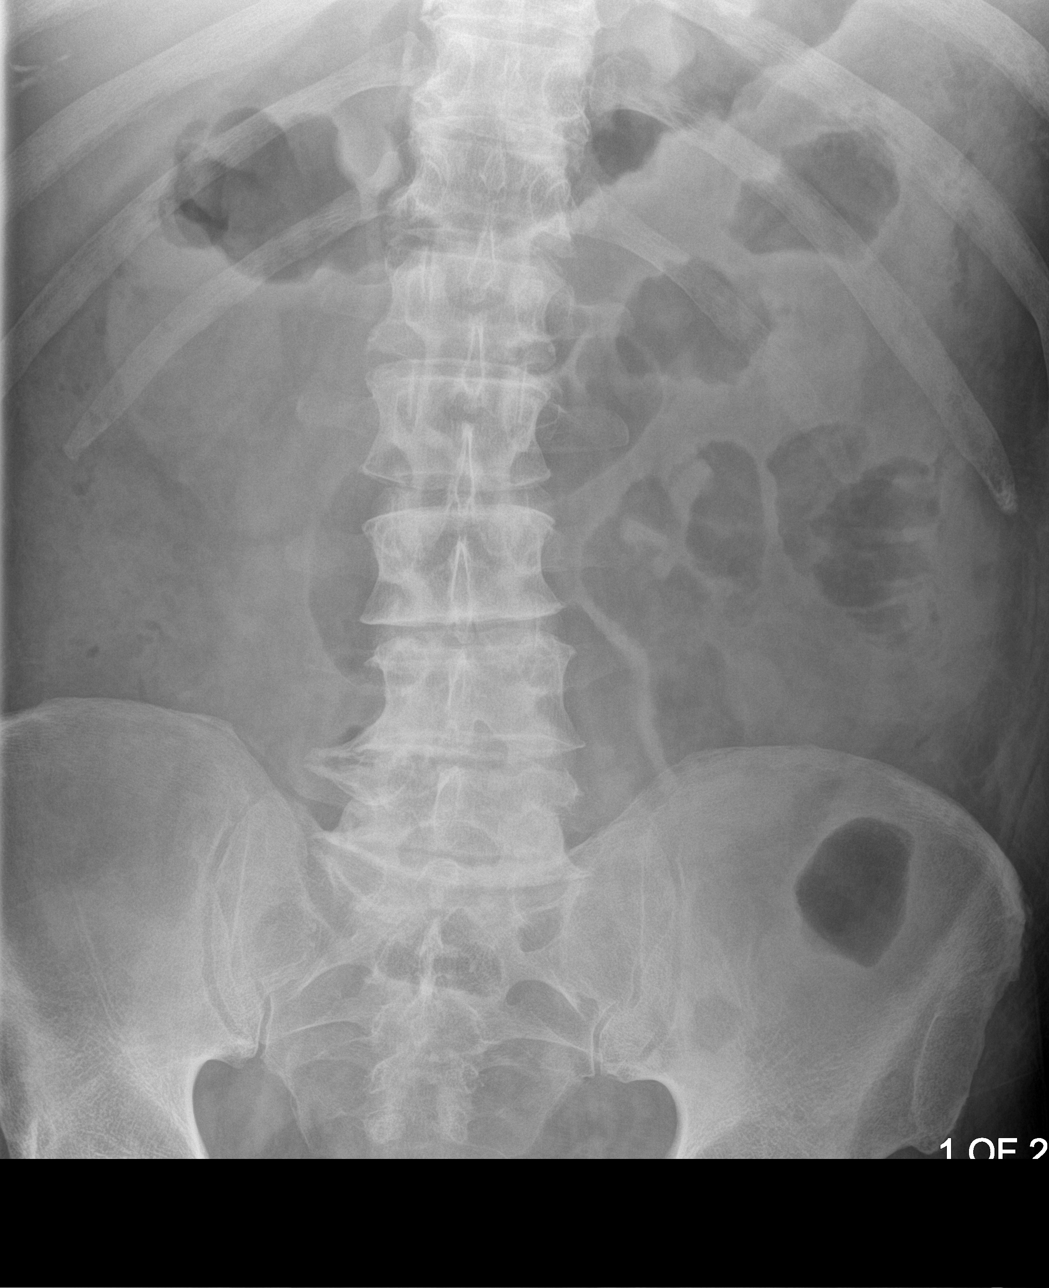
[im 3/3]
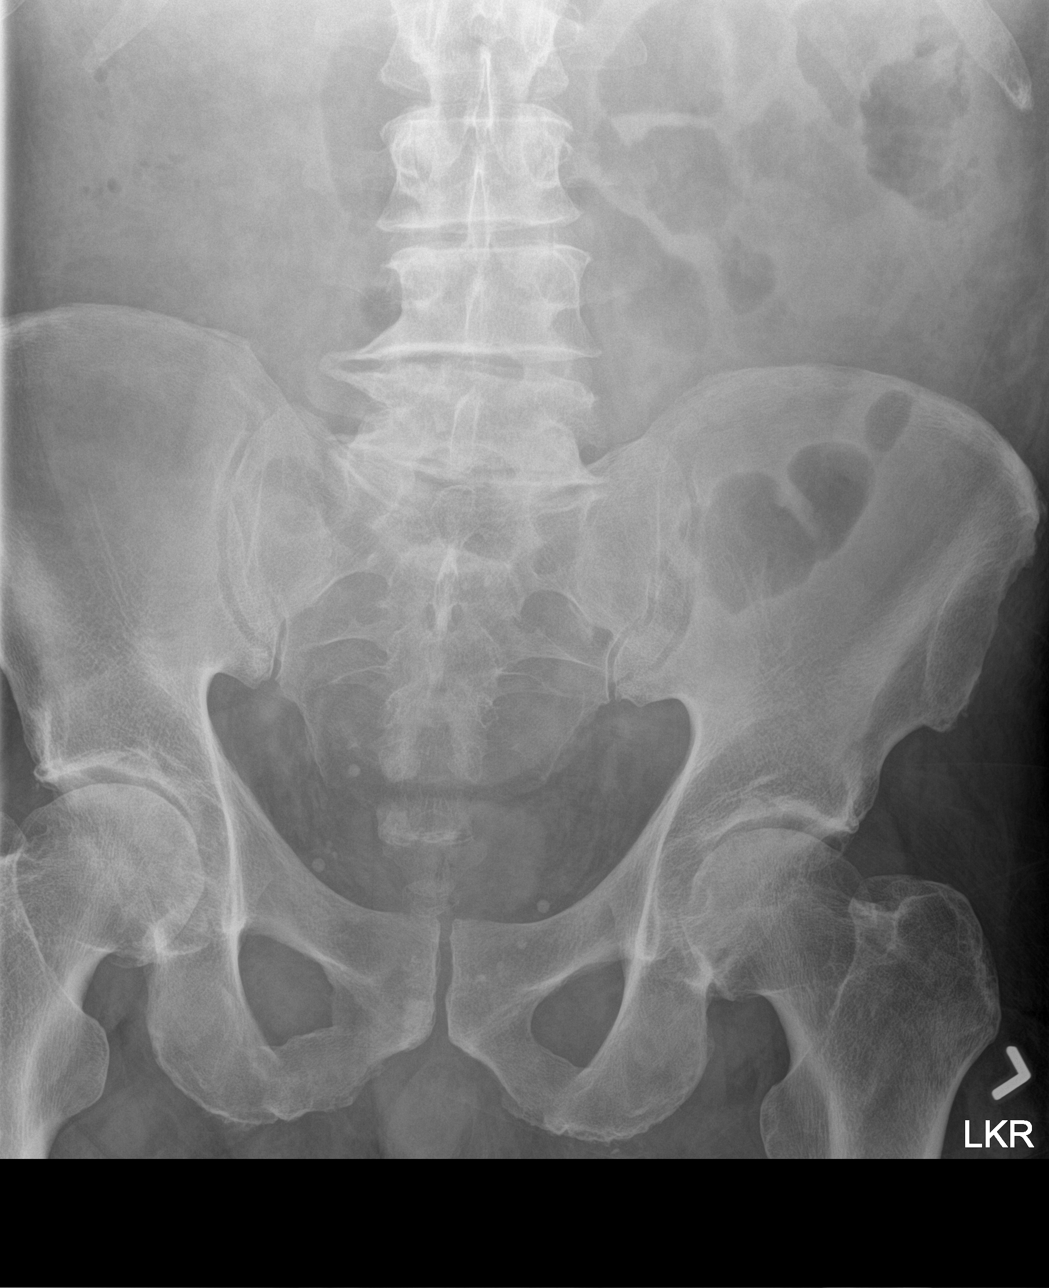

[3 of 3 positions shown; findings below may reference images not displayed]

FINDINGS: The bowel gas pattern is normal. There is no evidence of free air.
No renal calculi are noted. Phleboliths are seen in the pelvis.
IMPRESSION: No evidence of bowel obstruction or ileus.

## 2016-07-18 HISTORY — PX: BACK SURGERY: SHX140

## 2018-02-23 ENCOUNTER — Ambulatory Visit: Payer: Self-pay | Admitting: Podiatry

## 2018-03-06 ENCOUNTER — Encounter

## 2018-03-06 ENCOUNTER — Encounter: Payer: Self-pay | Admitting: Podiatry

## 2018-03-06 ENCOUNTER — Ambulatory Visit: Payer: BC Managed Care – PPO | Admitting: Podiatry

## 2018-03-06 DIAGNOSIS — IMO0001 Reserved for inherently not codable concepts without codable children: Secondary | ICD-10-CM | POA: Insufficient documentation

## 2018-03-06 DIAGNOSIS — L309 Dermatitis, unspecified: Secondary | ICD-10-CM | POA: Insufficient documentation

## 2018-03-06 DIAGNOSIS — L989 Disorder of the skin and subcutaneous tissue, unspecified: Secondary | ICD-10-CM

## 2018-03-06 DIAGNOSIS — K219 Gastro-esophageal reflux disease without esophagitis: Secondary | ICD-10-CM | POA: Insufficient documentation

## 2018-03-06 DIAGNOSIS — E785 Hyperlipidemia, unspecified: Secondary | ICD-10-CM | POA: Insufficient documentation

## 2018-03-06 DIAGNOSIS — M779 Enthesopathy, unspecified: Secondary | ICD-10-CM

## 2018-03-06 DIAGNOSIS — I10 Essential (primary) hypertension: Secondary | ICD-10-CM | POA: Insufficient documentation

## 2018-03-08 NOTE — Progress Notes (Signed)
   Subjective: 57 year old male presenting today as a new patient with a chief complaint of a painful callus lesion to the lateral right foot that has been present for the past three months. He states wearing shoes and walking increases the pain. He has tried using inserts and callus pads with some relief. Patient is here for further evaluation and treatment.   Past Medical History:  Diagnosis Date  . Bipolar 1 disorder ([place])   . GERD (gastroesophageal reflux disease)   . Hypertension   . Psoriasis      Objective:  Physical Exam General: Alert and oriented x3 in no acute distress  Dermatology: Hyperkeratotic lesion present on the right lateral forefoot. Pain on palpation with a central nucleated core noted. Skin is warm, dry and supple bilateral lower extremities. Negative for open lesions or macerations.  Vascular: Palpable pedal pulses bilaterally. No edema or erythema noted. Capillary refill within normal limits.  Neurological: Epicritic and protective threshold grossly intact bilaterally.   Musculoskeletal Exam: Pain on palpation at the keratotic lesion noted as well as to the 5th MPJ of the right foot. Range of motion within normal limits bilateral. Muscle strength 5/5 in all groups bilateral.  Assessment: 1. 5th MPJ capsulitis right 2. Porokeratosis right lateral forefoot    Plan of Care:  1. Patient evaluated 2. Excisional debridement of keratoic lesion using a chisel blade was performed without incident.  3. Dressed area with light dressing. 4. Injection of 0.5 mLs Celestone Soluspan injected into the 5th MPJ of the right foot.  5. Recommended wide fitting shoes.  6. Patient is to return to the clinic PRN.   Edrick Kins, DPM [facility]  Dr. Edrick Kins, [address]                                        [place], [place] [address]                Office [phone]  Fax [phone]

## 2019-10-03 ENCOUNTER — Other Ambulatory Visit: Payer: Self-pay | Admitting: Dermatology

## 2020-01-07 ENCOUNTER — Ambulatory Visit: Payer: BC Managed Care – PPO | Admitting: Dermatology

## 2020-01-07 ENCOUNTER — Other Ambulatory Visit: Payer: Self-pay

## 2020-01-07 DIAGNOSIS — L409 Psoriasis, unspecified: Secondary | ICD-10-CM | POA: Diagnosis not present

## 2020-01-07 NOTE — Progress Notes (Signed)
   Follow-Up Visit   Subjective  Bruce Chambers is a 59 y.o. male who presents for the following: Psoriasis (scalp, trunk, extremities, Humira, pt has been out of medication ~49m, no joint issues). The psoriasis is beginning to recur and he wants to get back on Humira.  The following portions of the chart were reviewed this encounter and updated as appropriate:  Tobacco  Allergies  Meds  Problems  Med Hx  Surg Hx  Fam Hx      Review of Systems:  No other skin or systemic complaints except as noted in HPI or Assessment and Plan.  Objective  Well appearing patient in no apparent distress; mood and affect are within normal limits.  A focused examination was performed including extremities, including the arms, hands, fingers, and fingernails and  head, including the scalp, face, neck, nose, ears, eyelids, and lips. Relevant physical exam findings are noted in the Assessment and Plan.  Objective  scalp,trunk, extremities: Guttate areas on back   Assessment & Plan    Psoriasis - severe - covering entire body prior to Humira treatment.  Pt wants to re-start Humira. scalp,trunk, extremities  Pending labs restart Humira 40mg  sq injections qowk, will send into [facility]  Other Related Procedures Comprehensive metabolic panel CBC with Differential/Platelet QuantiFERON-TB Gold Plus  Return in about 4 months (around 05/08/2020) for psoriasis.   I, Othelia Pulling, RMA, am acting as scribe for Sarina Ser, MD .  Documentation: I have reviewed the above documentation for accuracy and completeness, and I agree with the above.  Sarina Ser, MD

## 2020-01-08 ENCOUNTER — Encounter: Payer: Self-pay | Admitting: Dermatology

## 2020-01-09 LAB — COMPREHENSIVE METABOLIC PANEL
ALT: 26 IU/L (ref 0–44)
AST: 22 IU/L (ref 0–40)
Albumin/Globulin Ratio: 2 (ref 1.2–2.2)
Albumin: 4.5 g/dL (ref 3.8–4.8)
Alkaline Phosphatase: 69 IU/L (ref 48–121)
BUN/Creatinine Ratio: 20 (ref 10–24)
BUN: 23 mg/dL (ref 8–27)
Bilirubin Total: 0.2 mg/dL (ref 0.0–1.2)
CO2: 23 mmol/L (ref 20–29)
Calcium: 8.9 mg/dL (ref 8.6–10.2)
Chloride: 103 mmol/L (ref 96–106)
Creatinine, Ser: 1.16 mg/dL (ref 0.76–1.27)
GFR calc Af Amer: 78 mL/min/{1.73_m2} (ref >59)
GFR calc non Af Amer: 68 mL/min/{1.73_m2} (ref >59)
Globulin, Total: 2.3 g/dL (ref 1.5–4.5)
Glucose: 100 mg/dL — ABNORMAL HIGH (ref 65–99)
Potassium: 4.2 mmol/L (ref 3.5–5.2)
Sodium: 139 mmol/L (ref 134–144)
Total Protein: 6.8 g/dL (ref 6.0–8.5)

## 2020-01-09 LAB — CBC WITH DIFFERENTIAL/PLATELET
Basophils Absolute: 0.1 10*3/uL (ref 0.0–0.2)
Basos: 1 %
EOS (ABSOLUTE): 0.2 10*3/uL (ref 0.0–0.4)
Eos: 2 %
Hematocrit: 47 % (ref 37.5–51.0)
Hemoglobin: 15.5 g/dL (ref 13.0–17.7)
Immature Grans (Abs): 0 10*3/uL (ref 0.0–0.1)
Immature Granulocytes: 0 %
Lymphocytes Absolute: 2.9 10*3/uL (ref 0.7–3.1)
Lymphs: 36 %
MCH: 30.3 pg (ref 26.6–33.0)
MCHC: 33 g/dL (ref 31.5–35.7)
MCV: 92 fL (ref 79–97)
Monocytes Absolute: 0.7 10*3/uL (ref 0.1–0.9)
Monocytes: 9 %
Neutrophils Absolute: 4.3 10*3/uL (ref 1.4–7.0)
Neutrophils: 52 %
Platelets: 273 10*3/uL (ref 150–450)
RBC: 5.11 x10E6/uL (ref 4.14–5.80)
RDW: 13.2 % (ref 11.6–15.4)
WBC: 8.2 10*3/uL (ref 3.4–10.8)

## 2020-01-09 LAB — QUANTIFERON-TB GOLD PLUS
QuantiFERON Mitogen Value: >10 IU/mL
QuantiFERON Nil Value: 0.01 IU/mL
QuantiFERON TB1 Ag Value: 0 IU/mL
QuantiFERON TB2 Ag Value: 0.03 IU/mL
QuantiFERON-TB Gold Plus: NEGATIVE

## 2020-01-13 ENCOUNTER — Telehealth: Payer: Self-pay

## 2020-01-13 MED ORDER — HUMIRA (2 PEN) 40 MG/0.4ML ~~LOC~~ AJKT: 40.0000 mg | AUTO-INJECTOR | SUBCUTANEOUS | 3 refills | Status: DC

## 2020-01-13 NOTE — Telephone Encounter (Signed)
Patient informed of lab results. 

## 2020-01-13 NOTE — Telephone Encounter (Signed)
-----   Message from Ralene Bathe, MD sent at 01/09/2020  6:18 PM EDT ----- Lab on 01/07/20 was all OK. Blood counts and chemistries including liver and kidney all normal. TB test - negative = normal  Send Rx for Humira 3 mos supply with 1 rf.  Keep appts

## 2020-01-22 ENCOUNTER — Other Ambulatory Visit: Payer: Self-pay

## 2020-01-22 MED ORDER — HUMIRA (2 PEN) 40 MG/0.4ML ~~LOC~~ AJKT
40.0000 mg | AUTO-INJECTOR | SUBCUTANEOUS | 3 refills | Status: DC
Start: 2020-01-22 — End: 2020-05-07

## 2020-01-22 NOTE — Progress Notes (Signed)
Sent Humira to [facility] instead of [facility]

## 2020-02-05 ENCOUNTER — Telehealth: Payer: Self-pay

## 2020-02-05 NOTE — Telephone Encounter (Signed)
Called [facility] and was advised that patient's Humira has been approved with a $5 co-pay and will be delivered tomorrow, 02/06/20 to [place], [place]. I tried to call patient at [phone] but reached his VM. Left a message at 1:45pm today for patient advising him information per above, JS

## 2020-04-29 ENCOUNTER — Ambulatory Visit: Payer: BC Managed Care – PPO | Admitting: Dermatology

## 2020-04-29 ENCOUNTER — Other Ambulatory Visit: Payer: Self-pay

## 2020-04-29 DIAGNOSIS — L409 Psoriasis, unspecified: Secondary | ICD-10-CM | POA: Diagnosis not present

## 2020-04-29 NOTE — Progress Notes (Signed)
   Follow-Up Visit   Subjective  Bruce Chambers is a 59 y.o. male who presents for the following: Psoriasis (trunk, extremities, clear since restarting Humira 40 sq injections qowk).  The following portions of the chart were reviewed this encounter and updated as appropriate:  Tobacco  Allergies  Meds  Problems  Med Hx  Surg Hx  Fam Hx     Review of Systems:  No other skin or systemic complaints except as noted in HPI or Assessment and Plan.  Objective  Well appearing patient in no apparent distress; mood and affect are within normal limits.  A focused examination was performed including face, arms. Relevant physical exam findings are noted in the Assessment and Plan.  Objective  trunk, extremities: Clear today   Assessment & Plan  Psoriasis - severe and generalized on systemic Humira injections - now doing well and clear.  No side effects. trunk, extremities Severe Clear after restarting Humira Last labs 01/07/20  Cont Humira 40mg  sq injections qow  Return in about 6 months (around 10/28/2020) for Psoriasis f/u.   I, Othelia Pulling, RMA, am acting as scribe for Sarina Ser, MD .  Documentation: I have reviewed the above documentation for accuracy and completeness, and I agree with the above.  Sarina Ser, MD

## 2020-04-30 ENCOUNTER — Encounter: Payer: Self-pay | Admitting: Dermatology

## 2020-05-07 ENCOUNTER — Other Ambulatory Visit: Payer: Self-pay | Admitting: Dermatology

## 2020-07-18 HISTORY — PX: JOINT REPLACEMENT: SHX530

## 2020-09-30 ENCOUNTER — Other Ambulatory Visit: Payer: Self-pay | Admitting: Dermatology

## 2020-10-29 ENCOUNTER — Ambulatory Visit: Payer: BC Managed Care – PPO | Admitting: Dermatology

## 2020-11-05 ENCOUNTER — Other Ambulatory Visit: Payer: Self-pay

## 2020-11-05 ENCOUNTER — Ambulatory Visit: Payer: BC Managed Care – PPO | Admitting: Dermatology

## 2020-11-05 DIAGNOSIS — L570 Actinic keratosis: Secondary | ICD-10-CM | POA: Diagnosis not present

## 2020-11-05 DIAGNOSIS — L814 Other melanin hyperpigmentation: Secondary | ICD-10-CM | POA: Diagnosis not present

## 2020-11-05 DIAGNOSIS — D229 Melanocytic nevi, unspecified: Secondary | ICD-10-CM

## 2020-11-05 DIAGNOSIS — L409 Psoriasis, unspecified: Secondary | ICD-10-CM

## 2020-11-05 DIAGNOSIS — Z1283 Encounter for screening for malignant neoplasm of skin: Secondary | ICD-10-CM | POA: Diagnosis not present

## 2020-11-05 DIAGNOSIS — L578 Other skin changes due to chronic exposure to nonionizing radiation: Secondary | ICD-10-CM | POA: Diagnosis not present

## 2020-11-05 DIAGNOSIS — D1800 Hemangioma unspecified site: Secondary | ICD-10-CM

## 2020-11-05 DIAGNOSIS — L821 Other seborrheic keratosis: Secondary | ICD-10-CM

## 2020-11-05 NOTE — Progress Notes (Signed)
Follow-Up Visit   Subjective  Bruce Chambers is a 60 y.o. male who presents for the following: Psoriasis (Trunk, extremities - patient doing well with Humira ). UBSE today too - pt wife noticed a lesion on his back that she would like checked. The patient presents for Upper Body Skin Exam (UBSE) for skin cancer screening and mole check.  Pt declines TBSE today.  The following portions of the chart were reviewed this encounter and updated as appropriate:   Tobacco  Allergies  Meds  Problems  Med Hx  Surg Hx  Fam Hx     Review of Systems:  No other skin or systemic complaints except as noted in HPI or Assessment and Plan.  Objective  Well appearing patient in no apparent distress; mood and affect are within normal limits.  All skin waist up examined.  Objective  Trunk, extremities: Clear.   Objective  R temple x 1: Erythematous thin papules/macules with gritty scale.   Assessment & Plan  Psoriasis -severe of the skin and generalized but currently clear on systemic biologic Humira injections.  No history of psoriatic arthritis. Trunk, extremities Psoriasis is a chronic non-curable, but treatable genetic/hereditary disease that may have other systemic features affecting other organ systems such as joints (Psoriatic Arthritis). It is associated with an increased risk of inflammatory bowel disease, heart disease, non-alcoholic fatty liver disease, and depression.    Continue Humira 40mg /mL SQ QOW. Reviewed risks of biologics including immunosuppression, infections, injection site reaction, and failure to improve condition. Goal is control of skin condition, not cure.  Some older biologics such as Humira and Enbrel may slightly increase risk of malignancy and may worsen congestive heart failure. The use of biologics requires long term medication management, including periodic office visits and monitoring of blood work.  CBC with Differential/Platelets - Trunk,  extremities  CMP - Trunk, extremities  QuantiFERON-TB Gold Plus - Trunk, extremities  AK (actinic keratosis) R temple x 1 Destruction of lesion - R temple x 1 Complexity: simple   Destruction method: cryotherapy   Informed consent: discussed and consent obtained   Timeout:  patient name, date of birth, surgical site, and procedure verified Lesion destroyed using liquid nitrogen: Yes   Region frozen until ice ball extended beyond lesion: Yes   Outcome: patient tolerated procedure well with no complications   Post-procedure details: wound care instructions given    Lentigines - Scattered tan macules - Due to sun exposure - Benign-appering, observe - Recommend daily broad spectrum sunscreen SPF 30+ to sun-exposed areas, reapply every 2 hours as needed. - Call for any changes  Seborrheic Keratoses - Stuck-on, waxy, tan-brown papules and/or plaques  - Benign-appearing - Discussed benign etiology and prognosis. - Observe - Call for any changes  Melanocytic Nevi - Tan-brown and/or pink-flesh-colored symmetric macules and papules - Benign appearing on exam today - Observation - Call clinic for new or changing moles - Recommend daily use of broad spectrum spf 30+ sunscreen to sun-exposed areas.   Hemangiomas - Red papules - Discussed benign nature - Observe - Call for any changes  Actinic Damage - Chronic condition, secondary to cumulative UV/sun exposure - diffuse scaly erythematous macules with underlying dyspigmentation - Recommend daily broad spectrum sunscreen SPF 30+ to sun-exposed areas, reapply every 2 hours as needed.  - Staying in the shade or wearing long sleeves, sun glasses (UVA+UVB protection) and wide brim hats (4-inch brim around the entire circumference of the hat) are also recommended for sun protection.  -  Call for new or changing lesions.  Return in about 6 months (around 05/07/2021) for psoriasis follow up .  Luther Redo, CMA, am acting as scribe  for Sarina Ser, MD .  Documentation: I have reviewed the above documentation for accuracy and completeness, and I agree with the above.  Sarina Ser, MD

## 2020-11-05 NOTE — Patient Instructions (Signed)

## 2020-11-09 ENCOUNTER — Encounter: Payer: Self-pay | Admitting: Dermatology

## 2020-11-17 ENCOUNTER — Other Ambulatory Visit: Payer: Self-pay | Admitting: Dermatology

## 2020-12-07 ENCOUNTER — Ambulatory Visit: Payer: BC Managed Care – PPO | Admitting: Dermatology

## 2021-03-04 ENCOUNTER — Other Ambulatory Visit: Payer: Self-pay

## 2021-03-04 DIAGNOSIS — Z79899 Other long term (current) drug therapy: Secondary | ICD-10-CM

## 2021-03-04 DIAGNOSIS — L400 Psoriasis vulgaris: Secondary | ICD-10-CM

## 2021-03-04 NOTE — Progress Notes (Signed)
New orders for labs. Patient lost previous requisition.

## 2021-03-10 LAB — CBC WITH DIFFERENTIAL/PLATELET
Basophils Absolute: 0.1 10*3/uL (ref 0.0–0.2)
Basos: 1 %
EOS (ABSOLUTE): 0.1 10*3/uL (ref 0.0–0.4)
Eos: 2 %
Hematocrit: 46.9 % (ref 37.5–51.0)
Hemoglobin: 16 g/dL (ref 13.0–17.7)
Immature Grans (Abs): 0 10*3/uL (ref 0.0–0.1)
Immature Granulocytes: 0 %
Lymphocytes Absolute: 3.1 10*3/uL (ref 0.7–3.1)
Lymphs: 36 %
MCH: 31.1 pg (ref 26.6–33.0)
MCHC: 34.1 g/dL (ref 31.5–35.7)
MCV: 91 fL (ref 79–97)
Monocytes Absolute: 0.8 10*3/uL (ref 0.1–0.9)
Monocytes: 9 %
Neutrophils Absolute: 4.6 10*3/uL (ref 1.4–7.0)
Neutrophils: 52 %
Platelets: 249 10*3/uL (ref 150–450)
RBC: 5.15 x10E6/uL (ref 4.14–5.80)
RDW: 13.2 % (ref 11.6–15.4)
WBC: 8.6 10*3/uL (ref 3.4–10.8)

## 2021-03-10 LAB — QUANTIFERON-TB GOLD PLUS
QuantiFERON Mitogen Value: >10 IU/mL
QuantiFERON Nil Value: 0.02 IU/mL
QuantiFERON TB1 Ag Value: 0.02 IU/mL
QuantiFERON TB2 Ag Value: 0.02 IU/mL
QuantiFERON-TB Gold Plus: NEGATIVE

## 2021-03-10 LAB — COMPREHENSIVE METABOLIC PANEL
ALT: 29 IU/L (ref 0–44)
AST: 22 IU/L (ref 0–40)
Albumin/Globulin Ratio: 1.7 (ref 1.2–2.2)
Albumin: 4.5 g/dL (ref 3.8–4.8)
Alkaline Phosphatase: 73 IU/L (ref 44–121)
BUN/Creatinine Ratio: 20 (ref 10–24)
BUN: 21 mg/dL (ref 8–27)
Bilirubin Total: 0.2 mg/dL (ref 0.0–1.2)
CO2: 21 mmol/L (ref 20–29)
Calcium: 9.1 mg/dL (ref 8.6–10.2)
Chloride: 103 mmol/L (ref 96–106)
Creatinine, Ser: 1.04 mg/dL (ref 0.76–1.27)
Globulin, Total: 2.6 g/dL (ref 1.5–4.5)
Glucose: 78 mg/dL (ref 65–99)
Potassium: 4.5 mmol/L (ref 3.5–5.2)
Sodium: 138 mmol/L (ref 134–144)
Total Protein: 7.1 g/dL (ref 6.0–8.5)
eGFR: 81 mL/min/{1.73_m2} (ref >59)

## 2021-03-16 ENCOUNTER — Telehealth: Payer: Self-pay

## 2021-03-16 NOTE — Telephone Encounter (Signed)
-----   Message from Ralene Bathe, MD sent at 03/10/2021  3:26 PM EDT ----- Labs from 03/04/21 are all OK. Chemistries and Blood counts are all normal. TB test / Quantiferon Gold is normal / negative Continue Humira for Psoriasis Keep appt October 2022.

## 2021-03-16 NOTE — Telephone Encounter (Signed)
Discussed labs WNL cont Humira injections, keep follow up appt

## 2021-05-13 ENCOUNTER — Ambulatory Visit: Payer: BC Managed Care – PPO | Admitting: Dermatology

## 2021-05-13 ENCOUNTER — Other Ambulatory Visit: Payer: Self-pay

## 2021-05-13 DIAGNOSIS — L409 Psoriasis, unspecified: Secondary | ICD-10-CM

## 2021-05-13 DIAGNOSIS — L578 Other skin changes due to chronic exposure to nonionizing radiation: Secondary | ICD-10-CM | POA: Diagnosis not present

## 2021-05-13 DIAGNOSIS — L738 Other specified follicular disorders: Secondary | ICD-10-CM

## 2021-05-13 MED ORDER — HUMIRA (2 PEN) 40 MG/0.4ML ~~LOC~~ AJKT: 40.0000 mg | AUTO-INJECTOR | SUBCUTANEOUS | 6 refills | Status: DC

## 2021-05-13 NOTE — Progress Notes (Signed)
   Follow-Up Visit   Subjective  Bruce Chambers is a 60 y.o. male who presents for the following: Psoriasis (Trunk, extremities, 74m f/u, Humira 40mg /ml sq injections qow).  His skin is clear.  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  The following portions of the chart were reviewed this encounter and updated as appropriate:   Tobacco  Allergies  Meds  Problems  Med Hx  Surg Hx  Fam Hx     Review of Systems:  No other skin or systemic complaints except as noted in HPI or Assessment and Plan.  Objective  Well appearing patient in no apparent distress; mood and affect are within normal limits.  A focused examination was performed including face, arms. Relevant physical exam findings are noted in the Assessment and Plan.  trunk, extremities Skin clear today  Assessment & Plan   Sebaceous Hyperplasia - Small yellow papules with a central dell - Benign - Observe   Psoriasis trunk, extremities  Psoriasis - severe on systemic "biologic" treatment injections.  Psoriasis is a chronic non-curable, but treatable genetic/hereditary disease that may have other systemic features affecting other organ systems such as joints (Psoriatic Arthritis).  It is linked with heart disease, inflammatory bowel disease, non-alcoholic fatty liver disease, and depression. Significant skin psoriasis and/or psoriatic arthritis may have significant symptoms and affects activities of daily activity and often benefits from systemic "biologic" injection treatments.  These "biologic" treatments have some potential side effects including immunosuppression and require pre-treatment laboratory screening and periodic laboratory monitoring and periodic in person evaluation and monitoring by the attending dermatologist physician (long term medication management).   With hx of arthritis but  not sure type.  Discussed psoriatic arthritis versus osteoarthritis versus rheumatoid arthritis in detail.  He is not sure type he has had but sounds more like osteoarthritis.  He had recent hip replacement. Discussed rheumatology evaluation.  He declines rheumatology evaluation at this time.  Last labs 02/2021.  He will be due for lab again after his next visit in 6 months.  Cont Humira 40mg  sq injections qowk  Adalimumab (HUMIRA PEN) 40 MG/0.4ML PNKT - trunk, extremities Inject 40 mg into the skin every 14 (fourteen) days. For maintenance.  Actinic Damage - chronic, secondary to cumulative UV radiation exposure/sun exposure over time - diffuse scaly erythematous macules with underlying dyspigmentation - Recommend daily broad spectrum sunscreen SPF 30+ to sun-exposed areas, reapply every 2 hours as needed.  - Recommend staying in the shade or wearing long sleeves, sun glasses (UVA+UVB protection) and wide brim hats (4-inch brim around the entire circumference of the hat). - Call for new or changing lesions.  Return in about 6 months (around 11/11/2021) for Psoriasis f/u.  I, Othelia Pulling, RMA, am acting as scribe for Sarina Ser, MD .  Documentation: I have reviewed the above documentation for accuracy and completeness, and I agree with the above.  Sarina Ser, MD

## 2021-05-13 NOTE — Patient Instructions (Signed)

## 2021-05-14 ENCOUNTER — Encounter: Payer: Self-pay | Admitting: Dermatology

## 2021-11-18 ENCOUNTER — Encounter: Payer: Self-pay | Admitting: Dermatology

## 2021-11-18 ENCOUNTER — Ambulatory Visit: Payer: BC Managed Care – PPO | Admitting: Dermatology

## 2021-11-18 DIAGNOSIS — D1800 Hemangioma unspecified site: Secondary | ICD-10-CM

## 2021-11-18 DIAGNOSIS — L578 Other skin changes due to chronic exposure to nonionizing radiation: Secondary | ICD-10-CM | POA: Diagnosis not present

## 2021-11-18 DIAGNOSIS — L821 Other seborrheic keratosis: Secondary | ICD-10-CM

## 2021-11-18 DIAGNOSIS — D229 Melanocytic nevi, unspecified: Secondary | ICD-10-CM

## 2021-11-18 DIAGNOSIS — L820 Inflamed seborrheic keratosis: Secondary | ICD-10-CM

## 2021-11-18 DIAGNOSIS — L409 Psoriasis, unspecified: Secondary | ICD-10-CM | POA: Diagnosis not present

## 2021-11-18 DIAGNOSIS — Z1283 Encounter for screening for malignant neoplasm of skin: Secondary | ICD-10-CM | POA: Diagnosis not present

## 2021-11-18 DIAGNOSIS — L814 Other melanin hyperpigmentation: Secondary | ICD-10-CM | POA: Diagnosis not present

## 2021-11-18 MED ORDER — HUMIRA (2 PEN) 40 MG/0.4ML ~~LOC~~ AJKT: 40.0000 mg | AUTO-INJECTOR | SUBCUTANEOUS | 6 refills | Status: DC

## 2021-11-18 NOTE — Patient Instructions (Addendum)
Cryotherapy Aftercare ? ?Wash gently with soap and water everyday.   ?Apply Vaseline and Band-Aid daily until healed.  ? ?Prior to procedure, discussed risks of blister formation, small wound, skin dyspigmentation, or rare scar following cryotherapy. Recommend Vaseline ointment to treated areas while healing.  ? ?Continue Humira 40 mg every 2 weeks as directed.  ? ?Reviewed risks of biologics including immunosuppression, infections, injection site reaction, and failure to improve condition. Goal is control of skin condition, not cure.  Some older biologics such as Humira and Enbrel may slightly increase risk of malignancy and may worsen congestive heart failure. The use of biologics requires long term medication management, including periodic office visits and monitoring of blood work.  ? ? ?If You Need Anything After Your Visit ? ?If you have any questions or concerns for your doctor, please call our main line at [phone] and press option 4 to reach your doctor's medical assistant. If no one answers, please leave a voicemail as directed and we will return your call as soon as possible. Messages left after 4 pm will be answered the following business day.  ? ?You may also send [place] a message via MyChart. We typically respond to MyChart messages within 1-2 business days. ? ?For prescription refills, please ask your pharmacy to contact our office. Our fax number is [phone]. ? ?If you have an urgent issue when the clinic is closed that cannot wait until the next business day, you can page your doctor at the number below.   ? ?Please note that while we do our best to be available for urgent issues outside of office hours, we are not available 24/7.  ? ?If you have an urgent issue and are unable to reach [place], you may choose to seek medical care at your doctor's office, retail clinic, urgent care center, or emergency room. ? ?If you have a medical emergency, please immediately call 911 or go to the emergency  department. ? ?Pager Numbers ? ?- Dr. Nehemiah Massed: [phone] ? ?- Dr. Laurence Ferrari: [phone] ? ?- Dr. Nicole Kindred: [phone] ? ?In the event of inclement weather, please call our main line at [phone] for an update on the status of any delays or closures. ? ?Dermatology Medication Tips: ?Please keep the boxes that topical medications come in in order to help keep track of the instructions about where and how to use these. Pharmacies typically print the medication instructions only on the boxes and not directly on the medication tubes.  ? ?If your medication is too expensive, please contact our office at [phone] option 4 or send [place] a message through [place].  ? ?We are unable to tell what your co-pay for medications will be in advance as this is different depending on your insurance coverage. However, we may be able to find a substitute medication at lower cost or fill out paperwork to get insurance to cover a needed medication.  ? ?If a prior authorization is required to get your medication covered by your insurance company, please allow [place] 1-2 business days to complete this process. ? ?Drug prices often vary depending on where the prescription is filled and some pharmacies may offer cheaper prices. ? ?The website www.goodrx.com contains coupons for medications through different pharmacies. The prices here do not account for what the cost may be with help from insurance (it may be cheaper with your insurance), but the website can give you the price if you did not use any insurance.  ?- You can print the associated coupon  and take it with your prescription to the pharmacy.  ?- You may also stop by our office during regular business hours and pick up a GoodRx coupon card.  ?- If you need your prescription sent electronically to a different pharmacy, notify our office through [facility] or by phone at [phone] option 4. ? ? ? ? ?Si Usted Necesita Algo Despu?s de Su Visita ? ?Tambi?n puede enviarnos un  mensaje a trav?s de MyChart. Por lo general respondemos a los mensajes de MyChart en el transcurso de 1 a 2 d?as h?biles. ? ?Para renovar recetas, por favor pida a su farmacia que se ponga en contacto con nuestra oficina. Nuestro n?mero de fax es el [phone]. ? ?Si tiene un asunto urgente cuando la cl?nica est? cerrada y que no puede esperar hasta el siguiente d?a h?bil, puede llamar/localizar a su doctor(a) al n?mero que aparece a continuaci?n.  ? ?Por favor, tenga en cuenta que aunque hacemos todo lo posible para estar disponibles para asuntos urgentes fuera del horario de oficina, no estamos disponibles las 24 horas del d?a, los 7 d?as de la semana.  ? ?Si tiene un problema urgente y no puede comunicarse con nosotros, puede optar por buscar atenci?n m?dica  en el consultorio de su doctor(a), en una cl?nica privada, en un centro de atenci?n urgente o en una sala de emergencias. ? ?Si tiene Engineer, maintenance (IT) m?dica, por favor llame inmediatamente al 911 o vaya a la sala de emergencias. ? ?N?meros de b?per ? ?- Dr. Nehemiah Massed: [phone] ? ?- Dra. Moye: [phone] ? ?- Dra. Nicole Kindred: [phone] ? ?En caso de inclemencias del tiempo, por favor llame a nuestra l?nea principal al [phone] para una actualizaci?n sobre el estado de cualquier retraso o cierre. ? ?Consejos para la medicaci?n en dermatolog?a: ?Por favor, guarde las cajas en las que vienen los medicamentos de uso t?pico para ayudarle a seguir las instrucciones sobre d?nde y c?mo usarlos. Las farmacias generalmente imprimen las instrucciones del medicamento s?lo en las cajas y no directamente en los tubos del [place].  ? ?Si su medicamento es muy caro, por favor, p?ngase en contacto con Zigmund Daniel llamando al [phone] y presione la opci?n 4 o env?enos un mensaje a trav?s de MyChart.  ? ?No podemos decirle cu?l ser? su copago por los medicamentos por adelantado ya que esto es diferente dependiendo de la cobertura de su seguro. Sin embargo,  es posible que podamos encontrar un medicamento sustituto a Electrical engineer un formulario para que el seguro cubra el medicamento que se considera necesario.  ? ?Si se requiere Ardelia Mems autorizaci?n previa para que su compa??a de seguros [place] su medicamento, por favor perm?tanos de 1 a 2 d?as h?biles para completar este proceso. ? ?Los precios de los medicamentos var?an con frecuencia dependiendo del Environmental consultant de d?nde se surte la receta y alguna farmacias pueden ofrecer precios m?s baratos. ? ?El sitio web www.goodrx.com tiene cupones para medicamentos de Airline pilot. Los precios aqu? no tienen en cuenta lo que podr?a costar con la ayuda del seguro (puede ser m?s barato con su seguro), pero el sitio web puede darle el precio si no utiliz? ning?n seguro.  ?- Puede imprimir el cup?n correspondiente y llevarlo con su receta a la farmacia.  ?- Tambi?n puede pasar por nuestra oficina durante el horario de atenci?n regular y recoger una tarjeta de cupones de GoodRx.  ?- Si necesita que su receta se env?e electr?nicamente a Chiropodist, informe a nuestra oficina a trav?s de  [place] o por tel?fono llamando al [phone] y presione la opci?n 4.  ?

## 2021-11-18 NOTE — Progress Notes (Signed)
Follow-Up Visit   Subjective  Bruce Chambers is a 61 y.o. male who presents for the following: Psoriasis (6 month follow up. On Humira, needs refills. Psoriasis controlled well. No adverse reactions), lesion (Back. Patient states his wife would like a spot checked), and Annual Exam (Upper body exam. Skin cancer screening. No Hx of skin cancer or DN). The patient presents for Upper Body Skin Exam (UBSE) for skin cancer screening and mole check.  The patient has spots, moles and lesions to be evaluated, some may be new or changing and the patient has concerns that these could be cancer.  The following portions of the chart were reviewed this encounter and updated as appropriate:  Tobacco  Allergies  Meds  Problems  Med Hx  Surg Hx  Fam Hx     Review of Systems: No other skin or systemic complaints except as noted in HPI or Assessment and Plan.  Objective  Well appearing patient in no apparent distress; mood and affect are within normal limits.  All skin waist up examined.  Left Forearm - Anterior Skin clear today.  Back x1 Erythematous keratotic or waxy stuck-on papule or plaque.   Assessment & Plan  Psoriasis Left Forearm - Anterior  Psoriasis is a chronic non-curable, but treatable genetic/hereditary disease that may have other systemic features affecting other organ systems such as joints (Psoriatic Arthritis). It is associated with an increased risk of inflammatory bowel disease, heart disease, non-alcoholic fatty liver disease, and depression.    Continue Humira 40 mg every 2 weeks as directed.   Denies joint pain.  Have labs performed by end of August 2023.  Adalimumab (HUMIRA PEN) 40 MG/0.4ML PNKT - Left Forearm - Anterior Inject 40 mg into the skin every 14 (fourteen) days. For maintenance.  Related Procedures Comprehensive metabolic panel CBC with Differential/Platelet QuantiFERON-TB Gold Plus  Inflamed seborrheic keratosis Back x1  Destruction of  lesion - Back x1 Complexity: simple   Destruction method: cryotherapy   Informed consent: discussed and consent obtained   Timeout:  patient name, date of birth, surgical site, and procedure verified Lesion destroyed using liquid nitrogen: Yes   Region frozen until ice ball extended beyond lesion: Yes   Outcome: patient tolerated procedure well with no complications   Post-procedure details: wound care instructions given    Lentigines - Scattered tan macules - Due to sun exposure - Benign-appearing, observe - Recommend daily broad spectrum sunscreen SPF 30+ to sun-exposed areas, reapply every 2 hours as needed. - Call for any changes  Seborrheic Keratoses - Stuck-on, waxy, tan-brown papules and/or plaques  - Benign-appearing - Discussed benign etiology and prognosis. - Observe - Call for any changes  Melanocytic Nevi - Tan-brown and/or pink-flesh-colored symmetric macules and papules - Benign appearing on exam today - Observation - Call clinic for new or changing moles - Recommend daily use of broad spectrum spf 30+ sunscreen to sun-exposed areas.   Hemangiomas - Red papules - Discussed benign nature - Observe - Call for any changes  Actinic Damage - Chronic condition, secondary to cumulative UV/sun exposure - diffuse scaly erythematous macules with underlying dyspigmentation - Recommend daily broad spectrum sunscreen SPF 30+ to sun-exposed areas, reapply every 2 hours as needed.  - Staying in the shade or wearing long sleeves, sun glasses (UVA+UVB protection) and wide brim hats (4-inch brim around the entire circumference of the hat) are also recommended for sun protection.  - Call for new or changing lesions.  Skin cancer screening performed today.  Return in about 6 months (around 05/21/2022) for Psoriasis Follow Up.  I, Emelia Salisbury, CMA, am acting as scribe for Sarina Ser, MD. Documentation: I have reviewed the above documentation for accuracy and  completeness, and I agree with the above.  Sarina Ser, MD

## 2021-12-03 ENCOUNTER — Encounter: Payer: Self-pay | Admitting: Dermatology

## 2021-12-29 ENCOUNTER — Other Ambulatory Visit: Payer: Self-pay | Admitting: Dermatology

## 2021-12-29 DIAGNOSIS — L409 Psoriasis, unspecified: Secondary | ICD-10-CM

## 2022-02-18 LAB — CBC WITH DIFFERENTIAL/PLATELET
Basophils Absolute: 0 10*3/uL (ref 0.0–0.2)
Basos: 1 %
EOS (ABSOLUTE): 0.1 10*3/uL (ref 0.0–0.4)
Eos: 1 %
Hematocrit: 47.6 % (ref 37.5–51.0)
Hemoglobin: 16.1 g/dL (ref 13.0–17.7)
Immature Grans (Abs): 0 10*3/uL (ref 0.0–0.1)
Immature Granulocytes: 0 %
Lymphocytes Absolute: 2.6 10*3/uL (ref 0.7–3.1)
Lymphs: 33 %
MCH: 31.4 pg (ref 26.6–33.0)
MCHC: 33.8 g/dL (ref 31.5–35.7)
MCV: 93 fL (ref 79–97)
Monocytes Absolute: 0.7 10*3/uL (ref 0.1–0.9)
Monocytes: 8 %
Neutrophils Absolute: 4.6 10*3/uL (ref 1.4–7.0)
Neutrophils: 57 %
Platelets: 254 10*3/uL (ref 150–450)
RBC: 5.13 x10E6/uL (ref 4.14–5.80)
RDW: 13.2 % (ref 11.6–15.4)
WBC: 7.9 10*3/uL (ref 3.4–10.8)

## 2022-02-18 LAB — COMPREHENSIVE METABOLIC PANEL
ALT: 32 IU/L (ref 0–44)
AST: 24 IU/L (ref 0–40)
Albumin/Globulin Ratio: 1.7 (ref 1.2–2.2)
Albumin: 4.3 g/dL (ref 3.9–4.9)
Alkaline Phosphatase: 66 IU/L (ref 44–121)
BUN/Creatinine Ratio: 19 (ref 10–24)
BUN: 21 mg/dL (ref 8–27)
Bilirubin Total: 0.4 mg/dL (ref 0.0–1.2)
CO2: 24 mmol/L (ref 20–29)
Calcium: 9.3 mg/dL (ref 8.6–10.2)
Chloride: 105 mmol/L (ref 96–106)
Creatinine, Ser: 1.1 mg/dL (ref 0.76–1.27)
Globulin, Total: 2.6 g/dL (ref 1.5–4.5)
Glucose: 96 mg/dL (ref 70–99)
Potassium: 4.4 mmol/L (ref 3.5–5.2)
Sodium: 142 mmol/L (ref 134–144)
Total Protein: 6.9 g/dL (ref 6.0–8.5)
eGFR: 75 mL/min/{1.73_m2} (ref >59)

## 2022-02-18 LAB — QUANTIFERON-TB GOLD PLUS
QuantiFERON Mitogen Value: >10 IU/mL
QuantiFERON Nil Value: 0.03 IU/mL
QuantiFERON TB1 Ag Value: 0.02 IU/mL
QuantiFERON TB2 Ag Value: 0.02 IU/mL
QuantiFERON-TB Gold Plus: NEGATIVE

## 2022-02-21 ENCOUNTER — Telehealth: Payer: Self-pay

## 2022-02-21 NOTE — Telephone Encounter (Signed)
Advised patient of lab results/hd

## 2022-02-21 NOTE — Telephone Encounter (Signed)
-----   Message from Ralene Bathe, MD sent at 02/18/2022 12:50 PM EDT ----- Labs from 02/15/2022 are all OK: Chemistries ; liver; kidney all normal Blood counts all normal TB test / quantiferon gold = negative/normal  Continue Humira for Psoriasis (if pt needs refills to make it to November 2023 appt, send in refills) Keep November follow up appt.

## 2022-02-24 ENCOUNTER — Other Ambulatory Visit: Payer: Self-pay | Admitting: Dermatology

## 2022-02-24 DIAGNOSIS — L409 Psoriasis, unspecified: Secondary | ICD-10-CM

## 2022-04-26 ENCOUNTER — Other Ambulatory Visit: Payer: Self-pay

## 2022-04-26 DIAGNOSIS — L409 Psoriasis, unspecified: Secondary | ICD-10-CM

## 2022-04-26 NOTE — Progress Notes (Signed)
Error

## 2022-04-28 ENCOUNTER — Other Ambulatory Visit: Payer: Self-pay

## 2022-04-28 DIAGNOSIS — L409 Psoriasis, unspecified: Secondary | ICD-10-CM

## 2022-04-28 MED ORDER — HUMIRA (2 PEN) 40 MG/0.4ML ~~LOC~~ AJKT: AUTO-INJECTOR | SUBCUTANEOUS | 0 refills | Status: DC

## 2022-04-28 NOTE — Progress Notes (Signed)
90 day supply of Humira sent in per Pharmacy request.   Patient scheduled to be seen November, last seen in May 2023. Labs completed August 2023. aw

## 2022-05-30 ENCOUNTER — Ambulatory Visit: Payer: BC Managed Care – PPO | Admitting: Dermatology

## 2022-05-30 DIAGNOSIS — L820 Inflamed seborrheic keratosis: Secondary | ICD-10-CM | POA: Diagnosis not present

## 2022-05-30 DIAGNOSIS — L578 Other skin changes due to chronic exposure to nonionizing radiation: Secondary | ICD-10-CM

## 2022-05-30 DIAGNOSIS — Z79899 Other long term (current) drug therapy: Secondary | ICD-10-CM

## 2022-05-30 DIAGNOSIS — L821 Other seborrheic keratosis: Secondary | ICD-10-CM | POA: Diagnosis not present

## 2022-05-30 DIAGNOSIS — L409 Psoriasis, unspecified: Secondary | ICD-10-CM

## 2022-05-30 MED ORDER — ZORYVE 0.3 % EX CREA: TOPICAL_CREAM | CUTANEOUS | 3 refills | Status: DC

## 2022-05-30 NOTE — Patient Instructions (Addendum)

## 2022-05-30 NOTE — Progress Notes (Signed)
Follow-Up Visit   Subjective  Bruce Chambers is a 61 y.o. male who presents for the following: Psoriasis (6 month psoriasis follow up. Currently on humira injections. Still has active right thigh.  Patient reports a spot at inside of left calf he would like checked. ). The patient has spots, moles and lesions to be evaluated, some may be new or changing and the patient has concerns that these could be cancer.  The following portions of the chart were reviewed this encounter and updated as appropriate:  Tobacco  Allergies  Meds  Problems  Med Hx  Surg Hx  Fam Hx     Review of Systems: No other skin or systemic complaints except as noted in HPI or Assessment and Plan.  Objective  Well appearing patient in no apparent distress; mood and affect are within normal limits.  A focused examination was performed including left medial calf, right thigh. Relevant physical exam findings are noted in the Assessment and Plan.  left medial calf x 1 Erythematous stuck-on, waxy papule or plaque   Assessment & Plan  Psoriasis Left Forearm - Anterior Psoriasis - severe on systemic "biologic" treatment injections.  Psoriasis is a chronic non-curable, but treatable genetic/hereditary disease that may have other systemic features affecting other organ systems such as joints (Psoriatic Arthritis).  It is linked with heart disease, inflammatory bowel disease, non-alcoholic fatty liver disease, and depression. Significant skin psoriasis and/or psoriatic arthritis may have significant symptoms and affects activities of daily activity and often benefits from systemic "biologic" injection treatments.  These "biologic" treatments have some potential side effects including immunosuppression and require pre-treatment laboratory screening and periodic laboratory monitoring and periodic in person evaluation and monitoring by the attending dermatologist physician (long term medication management).   Continue  Humira 40 mg every 2 weeks as directed.    Denies joint pain.   Have labs performed in August 2024  Start zoryve cream apply thin layer to affected area daily as needed.   Roflumilast (ZORYVE) 0.3 % CREA - Left Forearm - Anterior Apply topically to affected areas of psoriasis as needed daily  Related Medications Adalimumab (HUMIRA PEN) 40 MG/0.4ML PNKT Inject 1 pen SQ every 14 days.  Adalimumab (HUMIRA PEN) 40 MG/0.4ML PNKT Inject 40 mg into the skin every 14 (fourteen) days. For maintenance.  Inflamed seborrheic keratosis left medial calf x 1  Symptomatic, irritating, patient would like treated.  Destruction of lesion - left medial calf x 1 Complexity: simple   Destruction method: cryotherapy   Informed consent: discussed and consent obtained   Timeout:  patient name, date of birth, surgical site, and procedure verified Lesion destroyed using liquid nitrogen: Yes   Region frozen until ice ball extended beyond lesion: Yes   Outcome: patient tolerated procedure well with no complications   Post-procedure details: wound care instructions given   Additional details:  Prior to procedure, discussed risks of blister formation, small wound, skin dyspigmentation, or rare scar following cryotherapy. Recommend Vaseline ointment to treated areas while healing.   Seborrheic Keratoses - Stuck-on, waxy, tan-brown papules and/or plaques  - Benign-appearing - Discussed benign etiology and prognosis. - Observe - Call for any changes  Actinic Damage - chronic, secondary to cumulative UV radiation exposure/sun exposure over time - diffuse scaly erythematous macules with underlying dyspigmentation - Recommend daily broad spectrum sunscreen SPF 30+ to sun-exposed areas, reapply every 2 hours as needed.  - Recommend staying in the shade or wearing long sleeves, sun glasses (UVA+UVB protection) and  wide brim hats (4-inch brim around the entire circumference of the hat). - Call for new or  changing lesions.  Return in about 6 months (around 11/28/2022) for psoriasis. IRuthell Rummage, CMA, am acting as scribe for Sarina Ser, MD. Documentation: I have reviewed the above documentation for accuracy and completeness, and I agree with the above.  Sarina Ser, MD

## 2022-06-01 MED ORDER — HUMIRA (2 PEN) 40 MG/0.4ML ~~LOC~~ AJKT: 40.0000 mg | AUTO-INJECTOR | SUBCUTANEOUS | 6 refills | Status: DC

## 2022-06-04 ENCOUNTER — Encounter: Payer: Self-pay | Admitting: Dermatology

## 2022-06-20 ENCOUNTER — Other Ambulatory Visit: Payer: Self-pay

## 2022-06-20 DIAGNOSIS — L409 Psoriasis, unspecified: Secondary | ICD-10-CM

## 2022-06-20 MED ORDER — HUMIRA (2 PEN) 40 MG/0.4ML ~~LOC~~ AJKT: 40.0000 mg | AUTO-INJECTOR | SUBCUTANEOUS | 1 refills | Status: DC

## 2022-06-20 NOTE — Progress Notes (Signed)
[facility] left VM requesting 90 supply of Humira. aw

## 2022-09-07 DIAGNOSIS — M48062 Spinal stenosis, lumbar region with neurogenic claudication: Secondary | ICD-10-CM | POA: Insufficient documentation

## 2022-09-07 DIAGNOSIS — L409 Psoriasis, unspecified: Secondary | ICD-10-CM | POA: Insufficient documentation

## 2022-09-08 ENCOUNTER — Other Ambulatory Visit: Payer: Self-pay

## 2022-09-08 DIAGNOSIS — Z9189 Other specified personal risk factors, not elsewhere classified: Secondary | ICD-10-CM

## 2022-09-08 DIAGNOSIS — F3171 Bipolar disorder, in partial remission, most recent episode hypomanic: Secondary | ICD-10-CM

## 2022-09-20 ENCOUNTER — Ambulatory Visit
Admission: RE | Admit: 2022-09-20 | Discharge: 2022-09-20 | Disposition: A | Discharge status: Home / Self Care | Payer: BC Managed Care – PPO | Source: Ambulatory Visit | Attending: Family Medicine | Admitting: Family Medicine

## 2022-09-20 DIAGNOSIS — Z9189 Other specified personal risk factors, not elsewhere classified: Secondary | ICD-10-CM

## 2022-09-20 DIAGNOSIS — F3171 Bipolar disorder, in partial remission, most recent episode hypomanic: Secondary | ICD-10-CM | POA: Insufficient documentation

## 2022-09-27 DIAGNOSIS — I25118 Atherosclerotic heart disease of native coronary artery with other forms of angina pectoris: Secondary | ICD-10-CM | POA: Insufficient documentation

## 2022-10-06 ENCOUNTER — Telehealth: Payer: Self-pay

## 2022-10-06 NOTE — Telephone Encounter (Signed)
Patient called he got a letter in the mail stating his Humira will not longer be covered after.

## 2022-10-19 NOTE — Telephone Encounter (Signed)
Left message for patient to return my call. I would like to speak with patient regarding letter and him possibly bring copy by the office. aw

## 2022-11-15 ENCOUNTER — Ambulatory Visit: Payer: BC Managed Care – PPO | Admitting: Dermatology

## 2022-11-15 VITALS — BP 146/93

## 2022-11-15 DIAGNOSIS — L814 Other melanin hyperpigmentation: Secondary | ICD-10-CM

## 2022-11-15 DIAGNOSIS — Z79899 Other long term (current) drug therapy: Secondary | ICD-10-CM

## 2022-11-15 DIAGNOSIS — L578 Other skin changes due to chronic exposure to nonionizing radiation: Secondary | ICD-10-CM | POA: Diagnosis not present

## 2022-11-15 DIAGNOSIS — L821 Other seborrheic keratosis: Secondary | ICD-10-CM

## 2022-11-15 DIAGNOSIS — L409 Psoriasis, unspecified: Secondary | ICD-10-CM | POA: Diagnosis not present

## 2022-11-15 DIAGNOSIS — Z1283 Encounter for screening for malignant neoplasm of skin: Secondary | ICD-10-CM | POA: Diagnosis not present

## 2022-11-15 MED ORDER — ADALIMUMAB-ADAZ 40 MG/0.4ML ~~LOC~~ SOAJ: 1.0000 | SUBCUTANEOUS | 5 refills | Status: DC

## 2022-11-15 NOTE — Patient Instructions (Signed)
Due to recent changes in healthcare laws, you may see results of your pathology and/or laboratory studies on MyChart before the doctors have had a chance to review them. We understand that in some cases there may be results that are confusing or concerning to you. Please understand that not all results are received at the same time and often the doctors may need to interpret multiple results in order to provide you with the best plan of care or course of treatment. Therefore, we ask that you please give us 2 business days to thoroughly review all your results before contacting the office for clarification. Should we see a critical lab result, you will be contacted sooner.   If You Need Anything After Your Visit  If you have any questions or concerns for your doctor, please call our main line at [phone] and press option 4 to reach your doctor's medical assistant. If no one answers, please leave a voicemail as directed and we will return your call as soon as possible. Messages left after 4 pm will be answered the following business day.   You may also send us a message via MyChart. We typically respond to MyChart messages within 1-2 business days.  For prescription refills, please ask your pharmacy to contact our office. Our fax number is [phone].  If you have an urgent issue when the clinic is closed that cannot wait until the next business day, you can page your doctor at the number below.    Please note that while we do our best to be available for urgent issues outside of office hours, we are not available 24/7.   If you have an urgent issue and are unable to reach us, you may choose to seek medical care at your doctor's office, retail clinic, urgent care center, or emergency room.  If you have a medical emergency, please immediately call 911 or go to the emergency department.  Pager Numbers  - Dr. Kowalski: [phone]  - Dr. Moye: [phone]  - Dr. Stewart:  [phone]  In the event of inclement weather, please call our main line at [phone] for an update on the status of any delays or closures.  Dermatology Medication Tips: Please keep the boxes that topical medications come in in order to help keep track of the instructions about where and how to use these. Pharmacies typically print the medication instructions only on the boxes and not directly on the medication tubes.   If your medication is too expensive, please contact our office at [phone] option 4 or send us a message through MyChart.   We are unable to tell what your co-pay for medications will be in advance as this is different depending on your insurance coverage. However, we may be able to find a substitute medication at lower cost or fill out paperwork to get insurance to cover a needed medication.   If a prior authorization is required to get your medication covered by your insurance company, please allow us 1-2 business days to complete this process.  Drug prices often vary depending on where the prescription is filled and some pharmacies may offer cheaper prices.  The website www.goodrx.com contains coupons for medications through different pharmacies. The prices here do not account for what the cost may be with help from insurance (it may be cheaper with your insurance), but the website can give you the price if you did not use any insurance.  - You can print the associated coupon and take it with   your prescription to the pharmacy.  - You may also stop by our office during regular business hours and pick up a GoodRx coupon card.  - If you need your prescription sent electronically to a different pharmacy, notify our office through [facility] MyChart or by phone at [phone] option 4.     Si Usted Necesita Algo Despus de Su Visita  Tambin puede enviarnos un mensaje a travs de MyChart. Por lo general respondemos a los mensajes de MyChart en el transcurso de 1 a 2  das hbiles.  Para renovar recetas, por favor pida a su farmacia que se ponga en contacto con nuestra oficina. Nuestro nmero de fax es el [phone].  Si tiene un asunto urgente cuando la clnica est cerrada y que no puede esperar hasta el siguiente da hbil, puede llamar/localizar a su doctor(a) al nmero que aparece a continuacin.   Por favor, tenga en cuenta que aunque hacemos todo lo posible para estar disponibles para asuntos urgentes fuera del horario de oficina, no estamos disponibles las 24 horas del da, los 7 das de la semana.   Si tiene un problema urgente y no puede comunicarse con nosotros, puede optar por buscar atencin mdica  en el consultorio de su doctor(a), en una clnica privada, en un centro de atencin urgente o en una sala de emergencias.  Si tiene una emergencia mdica, por favor llame inmediatamente al 911 o vaya a la sala de emergencias.  Nmeros de bper  - Dr. Kowalski: [phone]  - Dra. Moye: [phone]  - Dra. Stewart: [phone]  En caso de inclemencias del tiempo, por favor llame a nuestra lnea principal al [phone] para una actualizacin sobre el estado de cualquier retraso o cierre.  Consejos para la medicacin en dermatologa: Por favor, guarde las cajas en las que vienen los medicamentos de uso tpico para ayudarle a seguir las instrucciones sobre dnde y cmo usarlos. Las farmacias generalmente imprimen las instrucciones del medicamento slo en las cajas y no directamente en los tubos del medicamento.   Si su medicamento es muy caro, por favor, pngase en contacto con nuestra oficina llamando al [phone] y presione la opcin 4 o envenos un mensaje a travs de MyChart.   No podemos decirle cul ser su copago por los medicamentos por adelantado ya que esto es diferente dependiendo de la cobertura de su seguro. Sin embargo, es posible que podamos encontrar un medicamento sustituto a menor costo o llenar un formulario para que el  seguro cubra el medicamento que se considera necesario.   Si se requiere una autorizacin previa para que su compaa de seguros cubra su medicamento, por favor permtanos de 1 a 2 das hbiles para completar este proceso.  Los precios de los medicamentos varan con frecuencia dependiendo del lugar de dnde se surte la receta y alguna farmacias pueden ofrecer precios ms baratos.  El sitio web www.goodrx.com tiene cupones para medicamentos de diferentes farmacias. Los precios aqu no tienen en cuenta lo que podra costar con la ayuda del seguro (puede ser ms barato con su seguro), pero el sitio web puede darle el precio si no utiliz ningn seguro.  - Puede imprimir el cupn correspondiente y llevarlo con su receta a la farmacia.  - Tambin puede pasar por nuestra oficina durante el horario de atencin regular y recoger una tarjeta de cupones de GoodRx.  - Si necesita que su receta se enve electrnicamente a una farmacia diferente, informe a nuestra oficina a travs de MyChart de [facility]   o por telfono llamando al [phone] y presione la opcin 4.  

## 2022-11-15 NOTE — Progress Notes (Signed)
   Follow-Up Visit   Subjective  Bruce Chambers is a 62 y.o. male who presents for the following: Psoriasis follow up - Humira is no longer covered by his insurance. He will be due for an injection 11/30/2022. The patient presents for Upper Body Skin Exam (UBSE) for skin cancer screening and mole check.  The patient has spots, moles and lesions to be evaluated, some may be new or changing and the patient has concerns that these could be cancer.  The following portions of the chart were reviewed this encounter and updated as appropriate: medications, allergies, medical history  Review of Systems:  No other skin or systemic complaints except as noted in HPI or Assessment and Plan.  Objective  Well appearing patient in no apparent distress; mood and affect are within normal limits. A focused examination was performed of the following areas: Waist up exam performed today. Relevant exam findings are noted in the Assessment and Plan.   Assessment & Plan   PSORIASIS Exam: skin clear today Chronic and persistent condition with duration or expected duration over one year. Condition is symptomatic / bothersome to patient.  Well-controlled. Psoriasis is a chronic non-curable, but treatable genetic/hereditary disease that may have other systemic features affecting other organ systems such as joints (Psoriatic Arthritis). It is associated with an increased risk of inflammatory bowel disease, heart disease, non-alcoholic fatty liver disease, and depression.  Treatments include light and laser treatments; topical medications; and systemic medications including oral and injectables.  Treatment Plan: Patient given order for yearly labs today. He will be due for labs 02/2023.  Start Hyrimoz (generic Humira ) 40mg /0.50ml 1 SQ every 2 weeks  LENTIGINES, SEBORRHEIC KERATOSES, HEMANGIOMAS - Benign normal skin lesions - Benign-appearing - Call for any changes  MELANOCYTIC NEVI - Tan-brown and/or  pink-flesh-colored symmetric macules and papules - Benign appearing on exam today - Observation - Call clinic for new or changing moles - Recommend daily use of broad spectrum spf 30+ sunscreen to sun-exposed areas.   ACTINIC DAMAGE - Chronic condition, secondary to cumulative UV/sun exposure - diffuse scaly erythematous macules with underlying dyspigmentation - Recommend daily broad spectrum sunscreen SPF 30+ to sun-exposed areas, reapply every 2 hours as needed.  - Staying in the shade or wearing long sleeves, sun glasses (UVA+UVB protection) and wide brim hats (4-inch brim around the entire circumference of the hat) are also recommended for sun protection.  - Call for new or changing lesions.  SKIN CANCER SCREENING PERFORMED TODAY  Psoriasis  Related Procedures Comprehensive metabolic panel CBC with Differential/Platelet QuantiFERON-TB Gold Plus  Related Medications Roflumilast (ZORYVE) 0.3 % CREA Apply topically to affected areas of psoriasis as needed daily  Adalimumab (HUMIRA PEN) 40 MG/0.4ML PNKT Inject 40 mg into the skin every 14 (fourteen) days. For maintenance.   Return in about 6 months (around 05/17/2023) for TBSE, Psoriasis.  I, Joanie Coddington, CMA, am acting as scribe for Armida Sans, MD .  Documentation: I have reviewed the above documentation for accuracy and completeness, and I agree with the above.  Armida Sans, MD

## 2022-11-20 ENCOUNTER — Encounter: Payer: Self-pay | Admitting: Dermatology

## 2022-12-01 ENCOUNTER — Ambulatory Visit: Payer: BC Managed Care – PPO | Admitting: Dermatology

## 2022-12-02 LAB — COMPREHENSIVE METABOLIC PANEL
ALT: 28 IU/L (ref 0–44)
AST: 27 IU/L (ref 0–40)
Albumin/Globulin Ratio: 1.8 (ref 1.2–2.2)
Albumin: 4.4 g/dL (ref 3.9–4.9)
Alkaline Phosphatase: 69 IU/L (ref 44–121)
BUN/Creatinine Ratio: 22 (ref 10–24)
BUN: 19 mg/dL (ref 8–27)
Bilirubin Total: 0.4 mg/dL (ref 0.0–1.2)
CO2: 21 mmol/L (ref 20–29)
Calcium: 9.3 mg/dL (ref 8.6–10.2)
Chloride: 103 mmol/L (ref 96–106)
Creatinine, Ser: 0.87 mg/dL (ref 0.76–1.27)
Globulin, Total: 2.5 g/dL (ref 1.5–4.5)
Glucose: 82 mg/dL (ref 70–99)
Potassium: 4.6 mmol/L (ref 3.5–5.2)
Sodium: 139 mmol/L (ref 134–144)
Total Protein: 6.9 g/dL (ref 6.0–8.5)
eGFR: 97 mL/min/{1.73_m2} (ref >59)

## 2022-12-02 LAB — CBC WITH DIFFERENTIAL/PLATELET
Basophils Absolute: 0.1 10*3/uL (ref 0.0–0.2)
Basos: 1 %
EOS (ABSOLUTE): 0.2 10*3/uL (ref 0.0–0.4)
Eos: 3 %
Hematocrit: 49.1 % (ref 37.5–51.0)
Hemoglobin: 16.4 g/dL (ref 13.0–17.7)
Immature Grans (Abs): 0 10*3/uL (ref 0.0–0.1)
Immature Granulocytes: 0 %
Lymphocytes Absolute: 3.3 10*3/uL — ABNORMAL HIGH (ref 0.7–3.1)
Lymphs: 40 %
MCH: 30.8 pg (ref 26.6–33.0)
MCHC: 33.4 g/dL (ref 31.5–35.7)
MCV: 92 fL (ref 79–97)
Monocytes Absolute: 0.9 10*3/uL (ref 0.1–0.9)
Monocytes: 11 %
Neutrophils Absolute: 3.7 10*3/uL (ref 1.4–7.0)
Neutrophils: 45 %
Platelets: 260 10*3/uL (ref 150–450)
RBC: 5.32 x10E6/uL (ref 4.14–5.80)
RDW: 13.3 % (ref 11.6–15.4)
WBC: 8.2 10*3/uL (ref 3.4–10.8)

## 2022-12-02 LAB — QUANTIFERON-TB GOLD PLUS
QuantiFERON Mitogen Value: >10 IU/mL
QuantiFERON Nil Value: 0.01 IU/mL
QuantiFERON TB1 Ag Value: 0.01 IU/mL
QuantiFERON TB2 Ag Value: 0.02 IU/mL
QuantiFERON-TB Gold Plus: NEGATIVE

## 2022-12-05 ENCOUNTER — Telehealth: Payer: Self-pay

## 2022-12-05 NOTE — Telephone Encounter (Signed)
-----   Message from Deirdre Evener, MD sent at 12/02/2022 12:57 PM EDT ----- Lab from 11/28/2022 shows: Blood counts = Normal Chemistries including liver and kidney = Normal TB test / Quantiferon Gold = Negative / Normal  Continue Biologic Injection for Psoriasis (Hyrimoz = generic for Humira) Keep follow up appt.

## 2022-12-05 NOTE — Telephone Encounter (Signed)
Advised pt of labs/sh 

## 2023-05-18 ENCOUNTER — Ambulatory Visit: Payer: BC Managed Care – PPO | Admitting: Dermatology

## 2023-05-18 DIAGNOSIS — Z7189 Other specified counseling: Secondary | ICD-10-CM

## 2023-05-18 DIAGNOSIS — L409 Psoriasis, unspecified: Secondary | ICD-10-CM | POA: Diagnosis not present

## 2023-05-18 DIAGNOSIS — Z79899 Other long term (current) drug therapy: Secondary | ICD-10-CM

## 2023-05-18 MED ORDER — ADALIMUMAB-ADAZ 40 MG/0.4ML ~~LOC~~ SOAJ: 1.0000 | SUBCUTANEOUS | 5 refills | Status: DC

## 2023-05-18 NOTE — Patient Instructions (Signed)

## 2023-05-18 NOTE — Progress Notes (Signed)
   Follow-Up Visit   Subjective  Bruce Chambers is a 62 y.o. male who presents for the following: Psoriasis, doing well on Hyrimoz with no s/e. Psoriasis clear today. Pt has arthritis, but not related to psoriasis.   The following portions of the chart were reviewed this encounter and updated as appropriate: medications, allergies, medical history  Review of Systems:  No other skin or systemic complaints except as noted in HPI or Assessment and Plan.  Objective  Well appearing patient in no apparent distress; mood and affect are within normal limits.  Areas Examined: The face, extremities  Relevant exam findings are noted in the Assessment and Plan.      Assessment & Plan   Encounter for long-term (current) use of high-risk medication  Related Procedures QuantiFERON-TB Gold Plus   PSORIASIS  Clear. BSA 0% on treatment   Chronic condition with duration or expected duration over one year. Currently well-controlled.  Treatment Plan: Continue Hyrimoz 40mg /0.89mL SQ Q2W. Reviewed risks of biologics including immunosuppression, infections, injection site reaction, and failure to improve condition. Goal is control of skin condition, not cure.  Some older biologics such as Humira and Enbrel may slightly increase risk of malignancy and may worsen congestive heart failure.  Taltz and Cosentyx may cause inflammatory bowel disease to flare. The use of biologics requires long term medication management, including periodic office visits and monitoring of blood work.  Counseling on psoriasis and coordination of care  psoriasis is a chronic non-curable, but treatable genetic/hereditary disease that may have other systemic features affecting other organ systems such as joints (Psoriatic Arthritis). It is associated with an increased risk of inflammatory bowel disease, heart disease, non-alcoholic fatty liver disease, and depression.  Treatments include light and laser treatments; topical  medications; and systemic medications including oral and injectables.  Return in about 6 months (around 11/15/2023) for TBSE.  Maylene Roes, CMA, am acting as scribe for Armida Sans, MD .   Documentation: I have reviewed the above documentation for accuracy and completeness, and I agree with the above.  Armida Sans, MD

## 2023-05-28 ENCOUNTER — Encounter: Payer: Self-pay | Admitting: Dermatology

## 2023-05-30 ENCOUNTER — Other Ambulatory Visit: Payer: Self-pay | Admitting: Dermatology

## 2023-10-10 ENCOUNTER — Encounter: Payer: Self-pay | Admitting: Diagnostic Neuroimaging

## 2023-10-10 ENCOUNTER — Ambulatory Visit: Payer: Self-pay | Admitting: Diagnostic Neuroimaging

## 2023-10-10 VITALS — BP 140/90 | HR 70 | Ht 73.0 in | Wt 213.0 lb

## 2023-10-10 DIAGNOSIS — G629 Polyneuropathy, unspecified: Secondary | ICD-10-CM | POA: Diagnosis not present

## 2023-10-10 DIAGNOSIS — D472 Monoclonal gammopathy: Secondary | ICD-10-CM

## 2023-10-10 NOTE — Progress Notes (Signed)
 [facility]  PATIENT: Bruce Chambers DOB: Sep 26, 1960  REFERRING CLINICIAN: Julio Sicks, MD HISTORY FROM: patient  REASON FOR VISIT: new consult   HISTORICAL  CHIEF COMPLAINT:  Chief Complaint  Patient presents with   New Patient (Initial Visit)    Patient in room #7 and alone. Patient states he is here to discuss his numbness, pain and heated from both legs and feet.     HISTORY OF PRESENT ILLNESS:   62 year old male here for evaluation of numbness and tingling in the feet.  Symptoms started around 2 years ago with numbness and discomfort in the toes and feet.  This has been gradual onset and progressive.  Also has some history of low back surgery many years ago at L2-3 level.  Continues to have some low back pain, knee pain issues.  No problems with fingers or hands.  Had EMG nerve conduction study at Ortho office which shows polyneuropathy.   REVIEW OF SYSTEMS: Full 14 system review of systems performed and negative with exception of: as per HPI.  ALLERGIES: Allergies  Allergen Reactions   Rosuvastatin Other (See Comments)    HOME MEDICATIONS: Outpatient Medications Prior to Visit  Medication Sig Dispense Refill   Adalimumab-adaz (HYRIMOZ) 40 MG/0.4ML SOAJ Inject 1 each into the skin every 14 (fourteen) days. 0.8 mL 5   buPROPion (WELLBUTRIN XL) 150 MG 24 hr tablet Take 1 tablet by mouth daily.     esomeprazole (NEXIUM) 20 MG capsule Take 20 mg by mouth daily at 12 noon.     lamoTRIgine (LAMICTAL) 100 MG tablet      lamoTRIgine (LAMICTAL) 150 MG tablet Take 75 mg by mouth daily.     Roflumilast (ZORYVE) 0.3 % CREA Apply topically to affected areas of psoriasis as needed daily 60 g 3   adalimumab (HUMIRA, 2 SYRINGE,) 40 MG/0.8ML prefilled syringe Take 40 mg by mouth daily.     Adalimumab (HUMIRA PEN) 40 MG/0.4ML PNKT Inject 40 mg into the skin every 14 (fourteen) days. For maintenance. (Patient not taking: Reported on 10/10/2023) 6 each 1   No  facility-administered medications prior to visit.    PAST MEDICAL HISTORY: Past Medical History:  Diagnosis Date   Bipolar 1 disorder (HCC)    GERD (gastroesophageal reflux disease)    Hypertension    Psoriasis     PAST SURGICAL HISTORY: History reviewed. No pertinent surgical history.  FAMILY HISTORY: History reviewed. No pertinent family history.  SOCIAL HISTORY: Social History   Socioeconomic History   Marital status: Single    Spouse name: Not on file   Number of children: Not on file   Years of education: Not on file   Highest education level: Not on file  Occupational History   Not on file  Tobacco Use   Smoking status: Never   Smokeless tobacco: Never  Substance and Sexual Activity   Alcohol use: Yes    Alcohol/week: 3.0 standard drinks of alcohol    Types: 3 Glasses of wine per week   Drug use: Never   Sexual activity: Not on file  Other Topics Concern   Not on file  Social History Narrative   Not on file   Social Drivers of Health   Financial Resource Strain: Low Risk  (09/22/2023)   Received from [facility]   Overall Financial Resource Strain (CARDIA)    Difficulty of Paying Living Expenses: Not hard at all  Food Insecurity: No Food Insecurity (09/22/2023)   Received from  [facility]   Hunger Vital Sign    Worried About Running Out of Food in the Last Year: Never true    Ran Out of Food in the Last Year: Never true  Transportation Needs: No Transportation Needs (09/22/2023)   Received from [facility] - Transportation    In the past 12 months, has lack of transportation kept you from medical appointments or from getting medications?: No    Lack of Transportation (Non-Medical): No  Physical Activity: Not on file  Stress: Not on file  Social Connections: Not on file  Intimate Partner Violence: Not on file     PHYSICAL EXAM  GENERAL EXAM/CONSTITUTIONAL: Vitals:  Vitals:   10/10/23  0834  BP: (!) 140/90  Pulse: 70  Weight: 213 lb (96.6 kg)  Height: 6\' 1"  (1.854 m)   Body mass index is 28.1 kg/m. Wt Readings from Last 3 Encounters:  10/10/23 213 lb (96.6 kg)  02/05/15 206 lb 3.2 oz (93.5 kg)   Patient is in no distress; well developed, nourished and groomed; neck is supple  CARDIOVASCULAR: Examination of carotid arteries is normal; no carotid bruits Regular rate and rhythm, no murmurs Examination of peripheral vascular system by observation and palpation is normal  EYES: Ophthalmoscopic exam of optic discs and posterior segments is normal; no papilledema or hemorrhages No results found.  MUSCULOSKELETAL: Gait, strength, tone, movements noted in Neurologic exam below  NEUROLOGIC: MENTAL STATUS:      No data to display         awake, alert, oriented to person, place and time recent and remote memory intact normal attention and concentration language fluent, comprehension intact, naming intact fund of knowledge appropriate  CRANIAL NERVE:  2nd - no papilledema on fundoscopic exam 2nd, 3rd, 4th, 6th - pupils equal and reactive to light, visual fields full to confrontation, extraocular muscles intact, no nystagmus 5th - facial sensation symmetric 7th - facial strength symmetric 8th - hearing intact 9th - palate elevates symmetrically, uvula midline 11th - shoulder shrug symmetric 12th - tongue protrusion midline  MOTOR:  normal bulk and tone, full strength in the BUE, BLE  SENSORY:  normal and symmetric to light touch, temperature, vibration; DECR PP IN FEET  COORDINATION:  finger-nose-finger, fine finger movements normal  REFLEXES:  deep tendon reflexes TRACE and symmetric  GAIT/STATION:  narrow based gait     DIAGNOSTIC DATA (LABS, IMAGING, TESTING) - I reviewed patient records, labs, notes, testing and imaging myself where available.  Lab Results  Component Value Date   WBC 8.2 11/28/2022   HGB 16.4 11/28/2022   HCT 49.1  11/28/2022   MCV 92 11/28/2022   PLT 260 11/28/2022      Component Value Date/Time   NA 139 11/28/2022 1148   K 4.6 11/28/2022 1148   CL 103 11/28/2022 1148   CO2 21 11/28/2022 1148   GLUCOSE 82 11/28/2022 1148   GLUCOSE 89 02/11/2015 0448   BUN 19 11/28/2022 1148   CREATININE 0.87 11/28/2022 1148   CALCIUM 9.3 11/28/2022 1148   PROT 6.9 11/28/2022 1148   ALBUMIN 4.4 11/28/2022 1148   AST 27 11/28/2022 1148   ALT 28 11/28/2022 1148   ALKPHOS 69 11/28/2022 1148   BILITOT 0.4 11/28/2022 1148   GFRNONAA 68 01/07/2020 1543   GFRAA 78 01/07/2020 1543   No results found for: "CHOL", "HDL", "LDLCALC", "LDLDIRECT", "TRIG", "CHOLHDL" No results found for: "HGBA1C" No results found for: "VITAMINB12" No results found  for: "TSH"  09/08/22  Component Ref Range & Units 1 yr ago  HCV Ab - [facility] Non Reactive Non Reactive   Component Ref Range & Units 1 yr ago  Hemoglobin A1C 4.2 - 5.6 % 5.6    ASSESSMENT AND PLAN  63 y.o. year old male here with:  Dx:  1. Neuropathy     PLAN:  DISTAL LOWER EXTREMITY NEUROPATHY (since ~2023)  - check neuropathy labs (follow up B12 level checked at neurosurgery office)  Painful neuropathy treatment options: -duloxetine 30-60mg  daily, amitriptyline 25-50mg  at bedtime, gabapentin 100-300mg  three times a day, pregabalin 75-150mg  twice a day -capsaicin cream, lidocaine patch / cream, alpha-lipoic acid 600mg  daily  Orders Placed This Encounter  Procedures   TSH   SPEP with IFE   SSA, SSB   Return for pending if symptoms worsen or fail to improve, pending test results.    Suanne Marker, MD 10/10/2023, 9:44 AM Certified in Neurology, Neurophysiology and Neuroimaging  [facility] [address] [place], [place] [address] [phone]

## 2023-10-10 NOTE — Patient Instructions (Signed)
  DISTAL LOWER EXTREMITY NEUROPATHY  - check labs  Painful neuropathy treatment options: -duloxetine 30-60mg  daily, amitriptyline 25-50mg  at bedtime, gabapentin 100-300mg  three times a day, pregabalin 75-150mg  twice a day -capsaicin cream, lidocaine patch / cream, alpha-lipoic acid 600mg  daily

## 2023-10-13 LAB — MULTIPLE MYELOMA PANEL, SERUM
Albumin SerPl Elph-Mcnc: 3.8 g/dL (ref 2.9–4.4)
Albumin/Glob SerPl: 1.3 (ref 0.7–1.7)
Alpha 1: 0.2 g/dL (ref 0.0–0.4)
Alpha2 Glob SerPl Elph-Mcnc: 0.6 g/dL (ref 0.4–1.0)
B-Globulin SerPl Elph-Mcnc: 1.1 g/dL (ref 0.7–1.3)
Gamma Glob SerPl Elph-Mcnc: 1.2 g/dL (ref 0.4–1.8)
Globulin, Total: 3.1 g/dL (ref 2.2–3.9)
IgA/Immunoglobulin A, Serum: 318 mg/dL (ref 61–437)
IgG (Immunoglobin G), Serum: 1207 mg/dL (ref 603–1613)
IgM (Immunoglobulin M), Srm: 110 mg/dL (ref 20–172)
M Protein SerPl Elph-Mcnc: 0.3 g/dL — ABNORMAL HIGH
Total Protein: 6.9 g/dL (ref 6.0–8.5)

## 2023-10-13 LAB — SJOGREN'S SYNDROME ANTIBODS(SSA + SSB)
ENA SSA (RO) Ab: 0.3 AI (ref 0.0–0.9)
ENA SSB (LA) Ab: <0.2 AI (ref 0.0–0.9)

## 2023-10-13 LAB — TSH: TSH: 1.86 u[IU]/mL (ref 0.450–4.500)

## 2023-10-20 NOTE — Addendum Note (Signed)
 Addended by: Joycelyn Schmid R on: 10/20/2023 11:26 AM   Modules accepted: Orders

## 2023-10-23 ENCOUNTER — Telehealth: Payer: Self-pay

## 2023-10-23 ENCOUNTER — Other Ambulatory Visit: Payer: Self-pay

## 2023-10-23 ENCOUNTER — Telehealth: Payer: Self-pay | Admitting: Diagnostic Neuroimaging

## 2023-10-23 NOTE — Telephone Encounter (Signed)
 Patient returned phone call, transferred to POD 3

## 2023-10-23 NOTE — Telephone Encounter (Signed)
-----   Message from [facility]ford [facility] sent at 10/20/2023 11:26 AM EDT ----- Labs some mild elevation of m-protein, sometimes associated with neuropathy. Recommend heme/onc consult for further eval. -VRP

## 2023-10-23 NOTE — Telephone Encounter (Signed)
 Referral for hematology/oncology fax to [facility]. Phone: [phone], Fax: [phone]

## 2023-11-06 ENCOUNTER — Inpatient Hospital Stay: Attending: Oncology | Admitting: Oncology

## 2023-11-06 ENCOUNTER — Inpatient Hospital Stay

## 2023-11-06 ENCOUNTER — Encounter: Payer: Self-pay | Admitting: Oncology

## 2023-11-06 VITALS — BP 153/86 | HR 91 | Temp 98.2°F | Resp 18 | Ht 73.0 in | Wt 209.0 lb

## 2023-11-06 DIAGNOSIS — Z8249 Family history of ischemic heart disease and other diseases of the circulatory system: Secondary | ICD-10-CM | POA: Insufficient documentation

## 2023-11-06 DIAGNOSIS — G629 Polyneuropathy, unspecified: Secondary | ICD-10-CM | POA: Insufficient documentation

## 2023-11-06 DIAGNOSIS — Z79899 Other long term (current) drug therapy: Secondary | ICD-10-CM | POA: Insufficient documentation

## 2023-11-06 DIAGNOSIS — D472 Monoclonal gammopathy: Secondary | ICD-10-CM

## 2023-11-06 DIAGNOSIS — Z7289 Other problems related to lifestyle: Secondary | ICD-10-CM | POA: Insufficient documentation

## 2023-11-06 DIAGNOSIS — Z825 Family history of asthma and other chronic lower respiratory diseases: Secondary | ICD-10-CM | POA: Diagnosis not present

## 2023-11-06 DIAGNOSIS — E7800 Pure hypercholesterolemia, unspecified: Secondary | ICD-10-CM | POA: Insufficient documentation

## 2023-11-06 DIAGNOSIS — G609 Hereditary and idiopathic neuropathy, unspecified: Secondary | ICD-10-CM | POA: Insufficient documentation

## 2023-11-06 LAB — BASIC METABOLIC PANEL - CANCER CENTER ONLY
Anion gap: 8 (ref 5–15)
BUN: 27 mg/dL — ABNORMAL HIGH (ref 8–23)
CO2: 23 mmol/L (ref 22–32)
Calcium: 8.8 mg/dL — ABNORMAL LOW (ref 8.9–10.3)
Chloride: 105 mmol/L (ref 98–111)
Creatinine: 1 mg/dL (ref 0.61–1.24)
GFR, Estimated: >60 mL/min (ref >60)
Glucose, Bld: 119 mg/dL — ABNORMAL HIGH (ref 70–99)
Potassium: 3.9 mmol/L (ref 3.5–5.1)
Sodium: 136 mmol/L (ref 135–145)

## 2023-11-06 LAB — CBC (CANCER CENTER ONLY)
HCT: 47.2 % (ref 39.0–52.0)
Hemoglobin: 16.4 g/dL (ref 13.0–17.0)
MCH: 31.5 pg (ref 26.0–34.0)
MCHC: 34.7 g/dL (ref 30.0–36.0)
MCV: 90.8 fL (ref 80.0–100.0)
Platelet Count: 227 10*3/uL (ref 150–400)
RBC: 5.2 MIL/uL (ref 4.22–5.81)
RDW: 13.1 % (ref 11.5–15.5)
WBC Count: 10.3 10*3/uL (ref 4.0–10.5)
nRBC: 0 % (ref 0.0–0.2)

## 2023-11-06 NOTE — Progress Notes (Signed)
 [facility]  [phone] [phone]  ID: Blondie Burke Devore OB: 1961-02-09  MR#: [id number]  AOZ#:[id number]  Patient Care Team: Monique Ano, MD as PCP - General (Family Medicine) Shellie Dials, MD as Consulting Physician (Oncology) Shellie Dials, MD as Consulting Physician (Hematology and Oncology)  CHIEF COMPLAINT: MGUS.  INTERVAL HISTORY: Patient is a 63 year old male who was noted to have a small M spike on workup for peripheral neuropathy.  He is referred for further evaluation.  He currently feels well and is asymptomatic.  He has no other neurologic complaints.  He denies any recent fevers or illnesses.  He has good appetite and denies weight loss.  He has no chest pain, shortness of breath, cough, or hemoptysis.  He denies any nausea, vomiting, constipation, or diarrhea.  He has no urinary complaints.  Patient offers no further specific complaints today.  REVIEW OF SYSTEMS:   Review of Systems  Constitutional: Negative.  Negative for fever, malaise/fatigue and weight loss.  Respiratory: Negative.  Negative for hemoptysis and shortness of breath.   Cardiovascular: Negative.  Negative for chest pain and leg swelling.  Gastrointestinal: Negative.  Negative for abdominal pain.  Genitourinary: Negative.  Negative for dysuria.  Musculoskeletal: Negative.  Negative for back pain.  Neurological:  Positive for sensory change. Negative for dizziness, focal weakness, weakness and headaches.  Psychiatric/Behavioral: Negative.  The patient is not nervous/anxious.     As per HPI. Otherwise, a complete review of systems is negative.  PAST MEDICAL HISTORY: Past Medical History:  Diagnosis Date   Bipolar 1 disorder (HCC)    GERD (gastroesophageal reflux disease)    Hypertension    Psoriasis    PVC (premature ventricular contraction)     PAST SURGICAL HISTORY: History reviewed. No pertinent surgical history.  FAMILY  HISTORY: Family History  Problem Relation Age of Onset   Emphysema Mother    Atrial fibrillation Father    Atrial fibrillation Sister     ADVANCED DIRECTIVES (Y/N):  N  HEALTH MAINTENANCE: Social History   Tobacco Use   Smoking status: Never   Smokeless tobacco: Never  Substance Use Topics   Alcohol use: Yes    Alcohol/week: 3.0 standard drinks of alcohol    Types: 3 Glasses of wine per week   Drug use: Never     Colonoscopy:  PAP:  Bone density:  Lipid panel:  Allergies  Allergen Reactions   Rosuvastatin Other (See Comments)    Current Outpatient Medications  Medication Sig Dispense Refill   Adalimumab -adaz (HYRIMOZ ) 40 MG/0.4ML SOAJ Inject 1 each into the skin every 14 (fourteen) days. 0.8 mL 5   aspirin EC 81 MG tablet Take 81 mg by mouth daily.     buPROPion  (WELLBUTRIN  XL) 150 MG 24 hr tablet Take 1 tablet by mouth daily.     esomeprazole (NEXIUM) 20 MG capsule Take 20 mg by mouth daily at 12 noon.     lamoTRIgine  (LAMICTAL ) 100 MG tablet      lamoTRIgine  (LAMICTAL ) 150 MG tablet Take 75 mg by mouth daily.     Roflumilast  (ZORYVE ) 0.3 % CREA Apply topically to affected areas of psoriasis as needed daily 60 g 3   No current facility-administered medications for this visit.    OBJECTIVE: Vitals:   11/06/23 1501  BP: (!) 153/86  Pulse: 91  Resp: 18  Temp: 98.2 F (36.8 C)  SpO2: 96%     Body mass index is 27.57 kg/m.    ECOG FS:0 -  Asymptomatic  General: Well-developed, well-nourished, no acute distress. Eyes: Pink conjunctiva, anicteric sclera. HEENT: Normocephalic, moist mucous membranes. Lungs: No audible wheezing or coughing. Heart: Regular rate and rhythm. Abdomen: Soft, nontender, no obvious distention. Musculoskeletal: No edema, cyanosis, or clubbing. Neuro: Alert, answering all questions appropriately. Cranial nerves grossly intact. Skin: No rashes or petechiae noted. Psych: Normal affect. Lymphatics: No cervical, calvicular, axillary or  inguinal LAD.   LAB RESULTS:  Lab Results  Component Value Date   NA 136 11/06/2023   K 3.9 11/06/2023   CL 105 11/06/2023   CO2 23 11/06/2023   GLUCOSE 119 (H) 11/06/2023   BUN 27 (H) 11/06/2023   CREATININE 1.00 11/06/2023   CALCIUM 8.8 (L) 11/06/2023   PROT 6.9 10/10/2023   ALBUMIN 4.4 11/28/2022   AST 27 11/28/2022   ALT 28 11/28/2022   ALKPHOS 69 11/28/2022   BILITOT 0.4 11/28/2022   GFRNONAA >60 11/06/2023   GFRAA 78 01/07/2020    Lab Results  Component Value Date   WBC 10.3 11/06/2023   NEUTROABS 3.7 11/28/2022   HGB 16.4 11/06/2023   HCT 47.2 11/06/2023   MCV 90.8 11/06/2023   PLT 227 11/06/2023     STUDIES: No results found.  ASSESSMENT: MGUS  PLAN:    MGUS: Patient noted to have a mild M spike of 0.3 as well as a mildly elevated kappa free light chain of 29.1.  Immunoglobulins are within normal limits.  Patient has no evidence of endorgan damage.  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  No intervention is needed.  He does not require imaging or bone marrow biopsy.  Return to clinic in 3 months with repeat laboratory work and further evaluation.  I spent a total of 45 minutes reviewing chart data, face-to-face evaluation with the patient, counseling and coordination of care as detailed above.  Patient expressed understanding and was in agreement with this plan. He also understands that He can call clinic at any time with any questions, concerns, or complaints.    Shellie Dials, MD   11/07/2023 4:44 PM

## 2023-11-07 LAB — PROTEIN ELECTROPHORESIS, SERUM
A/G Ratio: 1.3 (ref 0.7–1.7)
Albumin ELP: 3.7 g/dL (ref 2.9–4.4)
Alpha-1-Globulin: 0.1 g/dL (ref 0.0–0.4)
Alpha-2-Globulin: 0.5 g/dL (ref 0.4–1.0)
Beta Globulin: 1 g/dL (ref 0.7–1.3)
Gamma Globulin: 1.1 g/dL (ref 0.4–1.8)
Globulin, Total: 2.8 g/dL (ref 2.2–3.9)
M-Spike, %: 0.3 g/dL — ABNORMAL HIGH
Total Protein ELP: 6.5 g/dL (ref 6.0–8.5)

## 2023-11-07 LAB — IGG, IGA, IGM
IgA: 286 mg/dL (ref 61–437)
IgG (Immunoglobin G), Serum: 1135 mg/dL (ref 603–1613)
IgM (Immunoglobulin M), Srm: 98 mg/dL (ref 20–172)

## 2023-11-07 LAB — KAPPA/LAMBDA LIGHT CHAINS
Kappa free light chain: 29.1 mg/L — ABNORMAL HIGH (ref 3.3–19.4)
Kappa, lambda light chain ratio: 2.17 — ABNORMAL HIGH (ref 0.26–1.65)
Lambda free light chains: 13.4 mg/L (ref 5.7–26.3)

## 2023-11-10 ENCOUNTER — Ambulatory Visit: Admit: 2023-11-10 | Admitting: Internal Medicine

## 2023-11-10 DIAGNOSIS — R079 Chest pain, unspecified: Secondary | ICD-10-CM

## 2023-11-10 SURGERY — LEFT HEART CATH AND CORONARY ANGIOGRAPHY
Anesthesia: Moderate Sedation | Laterality: Left

## 2023-11-22 ENCOUNTER — Other Ambulatory Visit: Payer: Self-pay | Admitting: Dermatology

## 2023-11-29 ENCOUNTER — Encounter: Payer: Self-pay | Admitting: Dermatology

## 2023-11-29 ENCOUNTER — Ambulatory Visit (INDEPENDENT_AMBULATORY_CARE_PROVIDER_SITE_OTHER): Payer: BC Managed Care – PPO | Admitting: Dermatology

## 2023-11-29 DIAGNOSIS — D492 Neoplasm of unspecified behavior of bone, soft tissue, and skin: Secondary | ICD-10-CM | POA: Diagnosis not present

## 2023-11-29 DIAGNOSIS — Z1283 Encounter for screening for malignant neoplasm of skin: Secondary | ICD-10-CM

## 2023-11-29 DIAGNOSIS — W908XXA Exposure to other nonionizing radiation, initial encounter: Secondary | ICD-10-CM | POA: Diagnosis not present

## 2023-11-29 DIAGNOSIS — D239 Other benign neoplasm of skin, unspecified: Secondary | ICD-10-CM

## 2023-11-29 DIAGNOSIS — L409 Psoriasis, unspecified: Secondary | ICD-10-CM | POA: Diagnosis not present

## 2023-11-29 DIAGNOSIS — L570 Actinic keratosis: Secondary | ICD-10-CM | POA: Diagnosis not present

## 2023-11-29 DIAGNOSIS — D225 Melanocytic nevi of trunk: Secondary | ICD-10-CM

## 2023-11-29 DIAGNOSIS — D229 Melanocytic nevi, unspecified: Secondary | ICD-10-CM

## 2023-11-29 DIAGNOSIS — D1801 Hemangioma of skin and subcutaneous tissue: Secondary | ICD-10-CM

## 2023-11-29 DIAGNOSIS — L578 Other skin changes due to chronic exposure to nonionizing radiation: Secondary | ICD-10-CM

## 2023-11-29 DIAGNOSIS — Z7189 Other specified counseling: Secondary | ICD-10-CM

## 2023-11-29 DIAGNOSIS — L821 Other seborrheic keratosis: Secondary | ICD-10-CM

## 2023-11-29 DIAGNOSIS — Z79899 Other long term (current) drug therapy: Secondary | ICD-10-CM

## 2023-11-29 DIAGNOSIS — L814 Other melanin hyperpigmentation: Secondary | ICD-10-CM

## 2023-11-29 HISTORY — DX: Other benign neoplasm of skin, unspecified: D23.9

## 2023-11-29 MED ORDER — ADALIMUMAB-ADAZ 40 MG/0.4ML ~~LOC~~ SOAJ: 1.0000 | SUBCUTANEOUS | 5 refills | Status: DC

## 2023-11-29 NOTE — Patient Instructions (Addendum)
 Continue Hyrimoz  40 mg/0.4 ml injections every 2 weeks.  Reviewed risks of biologics including immunosuppression, infections, injection site reaction, and failure to improve condition. Goal is control of skin condition, not cure.  Some older biologics such as Humira  and Enbrel may slightly increase risk of malignancy and may worsen congestive heart failure.  Taltz and Cosentyx may cause inflammatory bowel disease to flare. The use of biologics requires long term medication management, including periodic office visits and monitoring of blood work.     Wound Care Instructions  Cleanse wound gently with soap and water once a day then pat dry with clean gauze. Apply a thin coat of Petrolatum (petroleum jelly, "Vaseline") over the wound (unless you have an allergy to this). We recommend that you use a new, sterile tube of Vaseline. Do not pick or remove scabs. Do not remove the yellow or white "healing tissue" from the base of the wound.  Cover the wound with fresh, clean, nonstick gauze and secure with paper tape. You may use Band-Aids in place of gauze and tape if the wound is small enough, but would recommend trimming much of the tape off as there is often too much. Sometimes Band-Aids can irritate the skin.  You should call the office for your biopsy report after 1 week if you have not already been contacted.  If you experience any problems, such as abnormal amounts of bleeding, swelling, significant bruising, significant pain, or evidence of infection, please call the office immediately.  FOR ADULT SURGERY PATIENTS: If you need something for pain relief you may take 1 extra strength Tylenol  (acetaminophen ) AND 2 Ibuprofen (200mg  each) together every 4 hours as needed for pain. (do not take these if you are allergic to them or if you have a reason you should not take them.) Typically, you may only need pain medication for 1 to 3 days.      Cryotherapy Aftercare  Wash gently with soap and water  everyday.   Apply Vaseline Jelly daily until healed.     Recommend daily broad spectrum sunscreen SPF 30+ to sun-exposed areas, reapply every 2 hours as needed. Call for new or changing lesions.  Staying in the shade or wearing long sleeves, sun glasses (UVA+UVB protection) and wide brim hats (4-inch brim around the entire circumference of the hat) are also recommended for sun protection.    Melanoma ABCDEs  Melanoma is the most dangerous type of skin cancer, and is the leading cause of death from skin disease.  You are more likely to develop melanoma if you: Have light-colored skin, light-colored eyes, or red or blond hair Spend a lot of time in the sun Tan regularly, either outdoors or in a tanning bed Have had blistering sunburns, especially during childhood Have a close family member who has had a melanoma Have atypical moles or large birthmarks  Early detection of melanoma is key since treatment is typically straightforward and cure rates are extremely high if we catch it early.   The first sign of melanoma is often a change in a mole or a new dark spot.  The ABCDE system is a way of remembering the signs of melanoma.  A for asymmetry:  The two halves do not match. B for border:  The edges of the growth are irregular. C for color:  A mixture of colors are present instead of an even brown color. D for diameter:  Melanomas are usually (but not always) greater than 6mm - the size of a pencil  eraser. E for evolution:  The spot keeps changing in size, shape, and color.  Please check your skin once per month between visits. You can use a small mirror in front and a large mirror behind you to keep an eye on the back side or your body.   If you see any new or changing lesions before your next follow-up, please call to schedule a visit.  Please continue daily skin protection including broad spectrum sunscreen SPF 30+ to sun-exposed areas, reapplying every 2 hours as needed when you're  outdoors.   Staying in the shade or wearing long sleeves, sun glasses (UVA+UVB protection) and wide brim hats (4-inch brim around the entire circumference of the hat) are also recommended for sun protection.     Due to recent changes in healthcare laws, you may see results of your pathology and/or laboratory studies on MyChart before the doctors have had a chance to review them. We understand that in some cases there may be results that are confusing or concerning to you. Please understand that not all results are received at the same time and often the doctors may need to interpret multiple results in order to provide you with the best plan of care or course of treatment. Therefore, we ask that you please give us  2 business days to thoroughly review all your results before contacting the office for clarification. Should we see a critical lab result, you will be contacted sooner.   If You Need Anything After Your Visit  If you have any questions or concerns for your doctor, please call our main line at [phone] and press option 4 to reach your doctor's medical assistant. If no one answers, please leave a voicemail as directed and we will return your call as soon as possible. Messages left after 4 pm will be answered the following business day.   You may also send us  a message via MyChart. We typically respond to MyChart messages within 1-2 business days.  For prescription refills, please ask your pharmacy to contact our office. Our fax number is [phone].  If you have an urgent issue when the clinic is closed that cannot wait until the next business day, you can page your doctor at the number below.    Please note that while we do our best to be available for urgent issues outside of office hours, we are not available 24/7.   If you have an urgent issue and are unable to reach us , you may choose to seek medical care at your doctor's office, retail clinic, urgent care center, or emergency  room.  If you have a medical emergency, please immediately call 911 or go to the emergency department.  Pager Numbers  - Dr. Bary Likes: [phone]  - Dr. Annette Barters: [phone]  - Dr. Felipe Horton: [phone]   In the event of inclement weather, please call our main line at [phone] for an update on the status of any delays or closures.  Dermatology Medication Tips: Please keep the boxes that topical medications come in in order to help keep track of the instructions about where and how to use these. Pharmacies typically print the medication instructions only on the boxes and not directly on the medication tubes.   If your medication is too expensive, please contact our office at [phone] option 4 or send us  a message through MyChart.   We are unable to tell what your co-pay for medications will be in advance as this is different depending on your insurance coverage.  However, we may be able to find a substitute medication at lower cost or fill out paperwork to get insurance to cover a needed medication.   If a prior authorization is required to get your medication covered by your insurance company, please allow us  1-2 business days to complete this process.  Drug prices often vary depending on where the prescription is filled and some pharmacies may offer cheaper prices.  The website www.goodrx.com contains coupons for medications through different pharmacies. The prices here do not account for what the cost may be with help from insurance (it may be cheaper with your insurance), but the website can give you the price if you did not use any insurance.  - You can print the associated coupon and take it with your prescription to the pharmacy.  - You may also stop by our office during regular business hours and pick up a GoodRx coupon card.  - If you need your prescription sent electronically to a different pharmacy, notify our office through [facility] or by phone at  [phone] option 4.     Si Usted Necesita Algo Despus de Su Visita  Tambin puede enviarnos un mensaje a travs de Clinical cytogeneticist. Por lo general respondemos a los mensajes de MyChart en el transcurso de 1 a 2 das hbiles.  Para renovar recetas, por favor pida a su farmacia que se ponga en contacto con nuestra oficina. Franz Jacks de fax es [place] [phone].  Si tiene un asunto urgente cuando la clnica est cerrada y que no puede esperar hasta el siguiente da hbil, puede llamar/localizar a su doctor(a) al nmero que aparece a continuacin.   Por favor, tenga en cuenta que aunque hacemos todo lo posible para estar disponibles para asuntos urgentes fuera del horario de [place], no estamos disponibles las 24 horas del da, los 7 [address]  [address] de la [place].   Si tiene un problema urgente y no puede comunicarse con nosotros, puede optar por buscar atencin mdica  en el consultorio de su doctor(a), en una clnica privada, en un centro de atencin urgente o en una sala de emergencias.  Si tiene Engineer, drilling, por favor llame inmediatamente al 911 o vaya a la sala de emergencias.  Nmeros de bper  - Dr. Bary Likes: [phone]  - Dra. Annette Barters: [phone]  - Dr. Felipe Horton: [phone]   En caso de inclemencias del tiempo, por favor llame a Lajuan Pila principal al [phone] para una actualizacin sobre el [place] de cualquier retraso o cierre.  Consejos para la medicacin en dermatologa: Por favor, guarde las cajas en las que vienen los medicamentos de uso tpico para ayudarle a seguir las instrucciones sobre dnde y cmo usarlos. Las farmacias generalmente imprimen las instrucciones del medicamento slo en las cajas y no directamente en los tubos del [place].   Si su medicamento es muy caro, por favor, pngase en contacto con Bettyjane Brunet llamando al [phone] y presione la opcin 4 o envenos un mensaje a travs de Clinical cytogeneticist.   No podemos decirle cul ser su copago por los  medicamentos por adelantado ya que esto es diferente dependiendo de la cobertura de su seguro. Sin embargo, es posible que podamos encontrar un medicamento sustituto a Audiological scientist un formulario para que el seguro cubra el medicamento que se considera necesario.   Si se requiere una autorizacin previa para que su compaa de seguros [place] su medicamento, por favor permtanos de 1 a 2 das hbiles para completar este proceso.  Los precios  de los medicamentos varan con frecuencia dependiendo del lugar de dnde se surte la receta y alguna farmacias pueden ofrecer precios ms baratos.  El sitio web www.goodrx.com tiene cupones para medicamentos de Health and safety inspector. Los precios aqu no tienen en cuenta lo que podra costar con la ayuda del seguro (puede ser ms barato con su seguro), pero el sitio web puede darle el precio si no utiliz Tourist information centre manager.  - Puede imprimir el cupn correspondiente y llevarlo con su receta a la farmacia.  - Tambin puede pasar por nuestra oficina durante el horario de atencin regular y Education officer, museum una tarjeta de cupones de GoodRx.  - Si necesita que su receta se enve electrnicamente a una farmacia diferente, informe a nuestra oficina a travs de MyChart de  o por telfono llamando al [phone] y presione la opcin 4.

## 2023-11-29 NOTE — Progress Notes (Unsigned)
 Follow-Up Visit   Subjective  Bruce Chambers is a 63 y.o. male who presents for the following: Skin Cancer Screening and Full Body Skin Exam. No personal Hx of skin cancer or dysplastic nevus.   Psoriasis. Currently on Hyrimoz  40 mg/0.4 ml injections every 2 weeks. Tolerating well. Denies adverse reactions, side effects or injection site reactions.   The patient presents for Total-Body Skin Exam (TBSE) for skin cancer screening and mole check. The patient has spots, moles and lesions to be evaluated, some may be new or changing and the patient may have concern these could be cancer.  The following portions of the chart were reviewed this encounter and updated as appropriate: medications, allergies, medical history  Review of Systems:  No other skin or systemic complaints except as noted in HPI or Assessment and Plan.  Objective  Well appearing patient in no apparent distress; mood and affect are within normal limits.  A full examination was performed including scalp, head, eyes, ears, nose, lips, neck, chest, axillae, abdomen, back, buttocks, bilateral upper extremities, bilateral lower extremities, hands, feet, fingers, toes, fingernails, and toenails. All findings within normal limits unless otherwise noted below.   Relevant physical exam findings are noted in the Assessment and Plan.  Right infrascapular 6 mm irregular brown macule  Left Ear and face x7 (7) Erythematous thin papules/macules with gritty scale.   Assessment & Plan   SKIN CANCER SCREENING PERFORMED TODAY.  ACTINIC DAMAGE - Chronic condition, secondary to cumulative UV/sun exposure - diffuse scaly erythematous macules with underlying dyspigmentation - Recommend daily broad spectrum sunscreen SPF 30+ to sun-exposed areas, reapply every 2 hours as needed.  - Staying in the shade or wearing long sleeves, sun glasses (UVA+UVB protection) and wide brim hats (4-inch brim around the entire circumference of the hat)  are also recommended for sun protection.  - Call for new or changing lesions.  LENTIGINES, SEBORRHEIC KERATOSES, HEMANGIOMAS - Benign normal skin lesions - Benign-appearing - Call for any changes  MELANOCYTIC NEVI - Tan-brown and/or pink-flesh-colored symmetric macules and papules - Benign appearing on exam today - Observation - Call clinic for new or changing moles - Recommend daily use of broad spectrum spf 30+ sunscreen to sun-exposed areas.   PSORIASIS on Systemic Treatment Exam: Clear 0% BSA. Chronic condition with duration or expected duration over one year. Currently well-controlled. patient denies joint pain Labs from 11/23/2023 reviewed. Quantiferon TB Gold was due 02/2023.  Psoriasis - severe on systemic treatment.  Psoriasis is a chronic non-curable, but treatable genetic/hereditary disease that may have other systemic features affecting other organ systems such as joints (Psoriatic Arthritis).  It is linked with heart disease, inflammatory bowel disease, non-alcoholic fatty liver disease, and depression. Significant skin psoriasis and/or psoriatic arthritis may have significant symptoms and affects activities of daily activity and often benefits from systemic treatments.  These systemic treatments have some potential side effects including immunosuppression and require pre-treatment laboratory screening and periodic laboratory monitoring and periodic in person evaluation and monitoring by the attending dermatologist physician (long term medication management).   Treatment Plan: Continue Hyrimoz  40 mg/0.4 ml injections every 2 weeks.  Reviewed risks of biologics including immunosuppression, infections, injection site reaction, and failure to improve condition. Goal is control of skin condition, not cure.  Some older biologics such as Humira  and Enbrel may slightly increase risk of malignancy and may worsen congestive heart failure.  Taltz and Cosentyx may cause inflammatory bowel  disease to flare. The use of biologics requires long  term medication management, including periodic office visits and monitoring of blood work.   Long term medication management.  Patient is using long term (months to years) prescription medication  to control their dermatologic condition.  These medications require periodic monitoring to evaluate for efficacy and side effects and may require periodic laboratory monitoring.    PSORIASIS   Related Procedures QuantiFERON-TB Gold Plus Related Medications Roflumilast  (ZORYVE ) 0.3 % CREA Apply topically to affected areas of psoriasis as needed daily ENCOUNTER FOR LONG-TERM (CURRENT) USE OF HIGH-RISK MEDICATION   Related Procedures QuantiFERON-TB Gold Plus NEOPLASM OF SKIN Right infrascapular Epidermal / dermal shaving  Lesion diameter (cm):  0.6 Informed consent: discussed and consent obtained   Timeout: patient name, date of birth, surgical site, and procedure verified   Procedure prep:  Patient was prepped and draped in usual sterile fashion Prep type:  Isopropyl alcohol Anesthesia: the lesion was anesthetized in a standard fashion   Anesthetic:  1% lidocaine w/ epinephrine 1-100,000 buffered w/ 8.4% NaHCO3 Instrument used: flexible razor blade   Hemostasis achieved with: pressure, aluminum chloride and electrodesiccation   Outcome: patient tolerated procedure well   Post-procedure details: sterile dressing applied and wound care instructions given   Dressing type: bandage and petrolatum   Specimen 1 - Surgical pathology Differential Diagnosis: Nevus, R/O dysplastic nevus  Check Margins: Yes AK (ACTINIC KERATOSIS) (7) Left Ear and face x7 (7) Actinic keratoses are precancerous spots that appear secondary to cumulative UV radiation exposure/sun exposure over time. They are chronic with expected duration over 1 year. A portion of actinic keratoses will progress to squamous cell carcinoma of the skin. It is not possible to reliably  predict which spots will progress to skin cancer and so treatment is recommended to prevent development of skin cancer.  Recommend daily broad spectrum sunscreen SPF 30+ to sun-exposed areas, reapply every 2 hours as needed.  Recommend staying in the shade or wearing long sleeves, sun glasses (UVA+UVB protection) and wide brim hats (4-inch brim around the entire circumference of the hat). Call for new or changing lesions. Destruction of lesion - Left Ear and face x7 (7)  Destruction method: cryotherapy   Informed consent: discussed and consent obtained   Lesion destroyed using liquid nitrogen: Yes   Region frozen until ice ball extended beyond lesion: Yes   Outcome: patient tolerated procedure well with no complications   Post-procedure details: wound care instructions given   Additional details:  Prior to procedure, discussed risks of blister formation, small wound, skin dyspigmentation, or rare scar following cryotherapy. Recommend Vaseline ointment to treated areas while healing.  Return in about 6 months (around 05/31/2024) for Psoriasis Follow Up.  I, Darcie Easterly, CMA, am acting as scribe for Celine Collard, MD.   Documentation: I have reviewed the above documentation for accuracy and completeness, and I agree with the above.  Celine Collard, MD

## 2023-11-30 ENCOUNTER — Encounter: Payer: Self-pay | Admitting: Dermatology

## 2023-12-04 ENCOUNTER — Ambulatory Visit: Payer: Self-pay | Admitting: Dermatology

## 2023-12-04 LAB — SURGICAL PATHOLOGY

## 2023-12-05 ENCOUNTER — Encounter: Payer: Self-pay | Admitting: Dermatology

## 2023-12-05 NOTE — Telephone Encounter (Signed)
 Advised pt of bx results/sh ?

## 2023-12-05 NOTE — Telephone Encounter (Signed)
-----   Message from Celine Collard sent at 12/04/2023  6:01 PM EDT ----- FINAL DIAGNOSIS        1. Skin, right infrascapular :       DYSPLASTIC JUNCTIONAL LENTIGINOUS NEVUS WITH MODERATE ATYPIA, LIMITED MARGINS       FREE   Moderate dysplastic Recheck next visit

## 2023-12-12 LAB — QUANTIFERON-TB GOLD PLUS
QuantiFERON Mitogen Value: >10 [IU]/mL
QuantiFERON Nil Value: 0.04 [IU]/mL
QuantiFERON TB1 Ag Value: 0.04 [IU]/mL
QuantiFERON TB2 Ag Value: 0.04 [IU]/mL
QuantiFERON-TB Gold Plus: NEGATIVE

## 2023-12-12 NOTE — Telephone Encounter (Addendum)
 Patient call regarding lab results. He verbalized understanding and denied further questions.   Patient had refills placed on May 14 and did not currently need refills ----- Message from Celine Collard sent at 12/12/2023 10:09 AM EDT ----- Lab from 11/2023 showed: TB test / Quantiferon Gold = Negative / Normal  Continue generic Humira  for Psoriasis Keep 6 mos follow up appt  Advise pt If generic Humira  not sent to pharmacy, please do so x 6 mos

## 2024-01-15 ENCOUNTER — Other Ambulatory Visit (INDEPENDENT_AMBULATORY_CARE_PROVIDER_SITE_OTHER): Payer: Self-pay

## 2024-01-15 ENCOUNTER — Ambulatory Visit (INDEPENDENT_AMBULATORY_CARE_PROVIDER_SITE_OTHER): Admitting: Orthopaedic Surgery

## 2024-01-15 DIAGNOSIS — M1712 Unilateral primary osteoarthritis, left knee: Secondary | ICD-10-CM | POA: Diagnosis not present

## 2024-01-15 DIAGNOSIS — G8929 Other chronic pain: Secondary | ICD-10-CM | POA: Diagnosis not present

## 2024-01-15 DIAGNOSIS — M25561 Pain in right knee: Secondary | ICD-10-CM | POA: Diagnosis not present

## 2024-01-15 DIAGNOSIS — M1711 Unilateral primary osteoarthritis, right knee: Secondary | ICD-10-CM | POA: Insufficient documentation

## 2024-01-15 DIAGNOSIS — M25562 Pain in left knee: Secondary | ICD-10-CM

## 2024-01-15 NOTE — Progress Notes (Signed)
 The patient is a 63 year old gentleman that I am seeing for the first time as a patient.  He came to us  since we have performed surgery on his wife.  He does have a remote history of a left total hip replacement was performed by one of my colleagues in town about 3 years ago.  He has a significant leg length discrepancy after that surgery but some of this is related to his spine which she has significant lumbar spine issues and the degenerative scoliosis.  He sees Dr. Malcolm for his back.  He also has known arthritis in both of his knees and some of his left hip.  He has been dealing with cardiac issues and eventually had a cardiac catheterization due to 90% blockage of the LAD.  That has helped him feel better and less fatigued.  He is very active at 63.  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  He is not a diabetic and he is not obese.  His right knee hurts him much worse than the left knee.  At this point is detrimentally vetting his mobility, his quality of life and his activities of daily living to the point he wants to consider knee replacement surgery sometime in the winter.  I was able to review all of his past medical history and medications within epic.  He is no longer on any blood thinning medications.  He does have chronic neuropathy as well he takes some Neurontin that helps minimal.  He has a very high pain tolerance.  On exam he does have a leg length difference with his left side longer than the right.  Both knees have varus malalignment that are correctable on both sides with patellofemoral crepitation and pain throughout the arc of motion of both knees.  X-rays of both knees shows varus malalignment with bone-on-bone wear the medial compartment as well as osteophytes in all 3 compartments and bone-on-bone wear the patellofemoral joint.  Had a long and thorough discussion about knee replacement surgery.  I showed him a knee replacement model and describe what the surgery involves from an  intraoperative and postoperative standpoint of things.  He said that if he does proceed with knee replacement surgery we would proceed with the right knee first.  He was thinking about having this in January of this next year if he can wait on it but he will let us  know if he needs to proceed sooner.  All questions and concerns were answered and addressed.  I encouraged him to reach out to us  at any time.

## 2024-02-05 ENCOUNTER — Other Ambulatory Visit

## 2024-02-05 ENCOUNTER — Inpatient Hospital Stay: Payer: Self-pay | Attending: Oncology

## 2024-02-12 ENCOUNTER — Ambulatory Visit: Admitting: Oncology

## 2024-02-19 ENCOUNTER — Telehealth: Payer: Self-pay | Admitting: Oncology

## 2024-02-19 ENCOUNTER — Inpatient Hospital Stay: Payer: Self-pay | Admitting: Oncology

## 2024-02-19 NOTE — Telephone Encounter (Signed)
 Per secure chat states the pt appt on 8/4 is suppose to be a lab only appt and then FU with Dr. Georgina one week later. I called the pt to get him rescheduled and the pt states he wasn't aware of any lab appts or appts with Dr. Georgina and he wants to cancel all his appts here.

## 2024-03-13 ENCOUNTER — Ambulatory Visit (INDEPENDENT_AMBULATORY_CARE_PROVIDER_SITE_OTHER): Admitting: Orthopaedic Surgery

## 2024-03-13 ENCOUNTER — Encounter: Payer: Self-pay | Admitting: Orthopaedic Surgery

## 2024-03-13 DIAGNOSIS — G8929 Other chronic pain: Secondary | ICD-10-CM | POA: Diagnosis not present

## 2024-03-13 DIAGNOSIS — M170 Bilateral primary osteoarthritis of knee: Secondary | ICD-10-CM | POA: Diagnosis not present

## 2024-03-13 DIAGNOSIS — M25562 Pain in left knee: Secondary | ICD-10-CM | POA: Diagnosis not present

## 2024-03-13 DIAGNOSIS — M25561 Pain in right knee: Secondary | ICD-10-CM

## 2024-03-13 DIAGNOSIS — M1712 Unilateral primary osteoarthritis, left knee: Secondary | ICD-10-CM

## 2024-03-13 DIAGNOSIS — M1711 Unilateral primary osteoarthritis, right knee: Secondary | ICD-10-CM

## 2024-03-13 MED ORDER — LIDOCAINE HCL 1 % IJ SOLN
3.0000 mL | INTRAMUSCULAR | Status: AC | PRN
  Administered 2024-03-13: 3 mL

## 2024-03-13 MED ORDER — METHYLPREDNISOLONE ACETATE 40 MG/ML IJ SUSP
40.0000 mg | INTRAMUSCULAR | Status: AC | PRN
  Administered 2024-03-13: 40 mg via INTRA_ARTICULAR

## 2024-03-13 NOTE — Progress Notes (Signed)
 The patient is well-known to us .  He is a 63 year old active gentleman with known significant arthritis of both of his knees that are bone-on-bone with varus malalignment and significant medial joint space narrowing and patellofemoral narrowing.  He has tried and failed all forms of conservative treatment and is at the point he would like to consider right total knee arthroplasty.  He still continues to workout on a regular basis and does not put a lot of high impact aerobic activities on his knees and this does help him from a cardiovascular standpoint but also psychologically.  He did have a stent placed in his heart back in April of this year and he is on Plavix.  He is requesting a steroid injection in both knees today and would like to consider right knee replacement in late October of this year.  His knees do hurt on a daily basis and they are detrimentally affecting his mobility, his quality of life and his actives daily living.  He does work on [employer].  He is not a diabetic.  He cannot take anti-inflammatories due to being on Plavix.  He has had a history of a left hip replacement done by one of my colleagues in town.  I was able to review all of his medications and past medical history within epic.  Again we have seen him before as well.  Both knees have varus malalignment that is correctable.  Both knees are ligamentously stable but painful throughout the arc of motion of the knees.  Again x-rays in the past have shown bone-on-bone arthritis of both knees with varus malalignment and osteophytes in all 3 compartments with significant narrowing.  We talked in length in detail about knee replacement surgery.  I discussed the risks and benefits of the surgery and what to expect from an intraoperative and postoperative standpoint.  I did place a steroid injection in both knees today and we will work on scheduling from a right total knee arthroplasty for late October.  All question  concerns were addressed and answered.    Procedure Note  Patient: Bruce Chambers.             Date of Birth: February 13, 1961           MRN: [id number]             Visit Date: 03/13/2024  Procedures: Visit Diagnoses:  1. Chronic pain of both knees   2. Unilateral primary osteoarthritis, right knee   3. Unilateral primary osteoarthritis, left knee     Large Joint Inj: R knee on 03/13/2024 10:05 AM Indications: diagnostic evaluation and pain Details: 22 G 1.5 in needle, superolateral approach  Arthrogram: No  Medications: 3 mL lidocaine  1 %; 40 mg methylPREDNISolone  acetate 40 MG/ML Outcome: tolerated well, no immediate complications Procedure, treatment alternatives, risks and benefits explained, specific risks discussed. Consent was given by the patient. Immediately prior to procedure a time out was called to verify the correct patient, procedure, equipment, support staff and site/side marked as required. Patient was prepped and draped in the usual sterile fashion.    Large Joint Inj: L knee on 03/13/2024 10:05 AM Indications: diagnostic evaluation and pain Details: 22 G 1.5 in needle, superolateral approach  Arthrogram: No  Medications: 3 mL lidocaine  1 %; 40 mg methylPREDNISolone  acetate 40 MG/ML Outcome: tolerated well, no immediate complications Procedure, treatment alternatives, risks and benefits explained, specific risks discussed. Consent was given by the patient. Immediately prior to procedure a time  out was called to verify the correct patient, procedure, equipment, support staff and site/side marked as required. Patient was prepped and draped in the usual sterile fashion.

## 2024-03-24 ENCOUNTER — Emergency Department
Admission: EM | Admit: 2024-03-24 | Discharge: 2024-03-24 | Disposition: A | Discharge status: Other Acute Inpatient Hospital | Source: Home / Self Care | Attending: Emergency Medicine | Admitting: Emergency Medicine

## 2024-03-24 ENCOUNTER — Inpatient Hospital Stay
Admission: AD | Admit: 2024-03-24 | Discharge: 2024-03-28 | DRG: 885 | Disposition: A | Discharge status: Home / Self Care | Source: Intra-hospital | Attending: Psychiatry | Admitting: Psychiatry

## 2024-03-24 ENCOUNTER — Other Ambulatory Visit: Payer: Self-pay

## 2024-03-24 DIAGNOSIS — Z7982 Long term (current) use of aspirin: Secondary | ICD-10-CM | POA: Diagnosis not present

## 2024-03-24 DIAGNOSIS — M25562 Pain in left knee: Secondary | ICD-10-CM | POA: Diagnosis present

## 2024-03-24 DIAGNOSIS — E7800 Pure hypercholesterolemia, unspecified: Secondary | ICD-10-CM | POA: Diagnosis present

## 2024-03-24 DIAGNOSIS — Z9151 Personal history of suicidal behavior: Secondary | ICD-10-CM | POA: Diagnosis not present

## 2024-03-24 DIAGNOSIS — Z818 Family history of other mental and behavioral disorders: Secondary | ICD-10-CM | POA: Diagnosis not present

## 2024-03-24 DIAGNOSIS — I10 Essential (primary) hypertension: Secondary | ICD-10-CM | POA: Diagnosis present

## 2024-03-24 DIAGNOSIS — Z888 Allergy status to other drugs, medicaments and biological substances status: Secondary | ICD-10-CM

## 2024-03-24 DIAGNOSIS — Z825 Family history of asthma and other chronic lower respiratory diseases: Secondary | ICD-10-CM | POA: Diagnosis not present

## 2024-03-24 DIAGNOSIS — R45851 Suicidal ideations: Secondary | ICD-10-CM | POA: Insufficient documentation

## 2024-03-24 DIAGNOSIS — Z79899 Other long term (current) drug therapy: Secondary | ICD-10-CM | POA: Diagnosis not present

## 2024-03-24 DIAGNOSIS — F3130 Bipolar disorder, current episode depressed, mild or moderate severity, unspecified: Secondary | ICD-10-CM | POA: Insufficient documentation

## 2024-03-24 DIAGNOSIS — F3181 Bipolar II disorder: Secondary | ICD-10-CM | POA: Diagnosis present

## 2024-03-24 DIAGNOSIS — M25561 Pain in right knee: Secondary | ICD-10-CM | POA: Diagnosis present

## 2024-03-24 DIAGNOSIS — K219 Gastro-esophageal reflux disease without esophagitis: Secondary | ICD-10-CM | POA: Diagnosis present

## 2024-03-24 DIAGNOSIS — F419 Anxiety disorder, unspecified: Secondary | ICD-10-CM | POA: Diagnosis present

## 2024-03-24 DIAGNOSIS — F319 Bipolar disorder, unspecified: Secondary | ICD-10-CM

## 2024-03-24 LAB — CBC
HCT: 51.1 % (ref 39.0–52.0)
Hemoglobin: 17.9 g/dL — ABNORMAL HIGH (ref 13.0–17.0)
MCH: 32.1 pg (ref 26.0–34.0)
MCHC: 35 g/dL (ref 30.0–36.0)
MCV: 91.6 fL (ref 80.0–100.0)
Platelets: 266 K/uL (ref 150–400)
RBC: 5.58 MIL/uL (ref 4.22–5.81)
RDW: 13.2 % (ref 11.5–15.5)
WBC: 9 K/uL (ref 4.0–10.5)
nRBC: 0 % (ref 0.0–0.2)

## 2024-03-24 LAB — URINE DRUG SCREEN, QUALITATIVE (ARMC ONLY)
Amphetamines, Ur Screen: NOT DETECTED
Barbiturates, Ur Screen: NOT DETECTED
Benzodiazepine, Ur Scrn: NOT DETECTED
Cannabinoid 50 Ng, Ur ~~LOC~~: NOT DETECTED
Cocaine Metabolite,Ur ~~LOC~~: NOT DETECTED
MDMA (Ecstasy)Ur Screen: NOT DETECTED
Methadone Scn, Ur: NOT DETECTED
Opiate, Ur Screen: NOT DETECTED
Phencyclidine (PCP) Ur S: NOT DETECTED
Tricyclic, Ur Screen: NOT DETECTED

## 2024-03-24 LAB — RESP PANEL BY RT-PCR (RSV, FLU A&B, COVID)  RVPGX2
Influenza A by PCR: NEGATIVE
Influenza B by PCR: NEGATIVE
Resp Syncytial Virus by PCR: NEGATIVE
SARS Coronavirus 2 by RT PCR: NEGATIVE

## 2024-03-24 LAB — URINALYSIS, ROUTINE W REFLEX MICROSCOPIC
Bilirubin Urine: NEGATIVE
Glucose, UA: NEGATIVE mg/dL
Hgb urine dipstick: NEGATIVE
Ketones, ur: NEGATIVE mg/dL
Leukocytes,Ua: NEGATIVE
Nitrite: NEGATIVE
Protein, ur: NEGATIVE mg/dL
Specific Gravity, Urine: 1.025 (ref 1.005–1.030)
pH: 6 (ref 5.0–8.0)

## 2024-03-24 LAB — COMPREHENSIVE METABOLIC PANEL WITH GFR
ALT: 24 U/L (ref 0–44)
AST: 21 U/L (ref 15–41)
Albumin: 4.5 g/dL (ref 3.5–5.0)
Alkaline Phosphatase: 49 U/L (ref 38–126)
Anion gap: 10 (ref 5–15)
BUN: 13 mg/dL (ref 8–23)
CO2: 25 mmol/L (ref 22–32)
Calcium: 9.4 mg/dL (ref 8.9–10.3)
Chloride: 101 mmol/L (ref 98–111)
Creatinine, Ser: 0.98 mg/dL (ref 0.61–1.24)
GFR, Estimated: >60 mL/min (ref >60)
Glucose, Bld: 105 mg/dL — ABNORMAL HIGH (ref 70–99)
Potassium: 4.4 mmol/L (ref 3.5–5.1)
Sodium: 136 mmol/L (ref 135–145)
Total Bilirubin: 1.1 mg/dL (ref 0.0–1.2)
Total Protein: 8 g/dL (ref 6.5–8.1)

## 2024-03-24 LAB — ETHANOL: Alcohol, Ethyl (B): <15 mg/dL (ref <15)

## 2024-03-24 MED ORDER — MAGNESIUM HYDROXIDE 400 MG/5ML PO SUSP: 30.0000 mL | Freq: Every day | ORAL | Status: DC | PRN

## 2024-03-24 MED ORDER — ADALIMUMAB-ADAZ 40 MG/0.4ML ~~LOC~~ SOAJ: 1.0000 | SUBCUTANEOUS | Status: DC

## 2024-03-24 MED ORDER — ACETAMINOPHEN 325 MG PO TABS
650.0000 mg | ORAL_TABLET | Freq: Four times a day (QID) | ORAL | Status: DC | PRN
  Administered 2024-03-26: 650 mg via ORAL
  Filled 2024-03-24 (×2): qty 2

## 2024-03-24 MED ORDER — OLANZAPINE 5 MG PO TABS
5.0000 mg | ORAL_TABLET | Freq: Two times a day (BID) | ORAL | Status: DC
  Administered 2024-03-24: 5 mg via ORAL
  Filled 2024-03-24 (×2): qty 1

## 2024-03-24 MED ORDER — LORAZEPAM 2 MG/ML IJ SOLN: 2.0000 mg | Freq: Three times a day (TID) | INTRAMUSCULAR | Status: DC | PRN

## 2024-03-24 MED ORDER — DIPHENHYDRAMINE HCL 50 MG/ML IJ SOLN: 50.0000 mg | Freq: Three times a day (TID) | INTRAMUSCULAR | Status: DC | PRN

## 2024-03-24 MED ORDER — PANTOPRAZOLE SODIUM 40 MG PO TBEC
40.0000 mg | DELAYED_RELEASE_TABLET | Freq: Every day | ORAL | Status: DC
  Administered 2024-03-26 – 2024-03-28 (×3): 40 mg via ORAL
  Filled 2024-03-24 (×4): qty 1

## 2024-03-24 MED ORDER — HALOPERIDOL LACTATE 5 MG/ML IJ SOLN: 5.0000 mg | Freq: Three times a day (TID) | INTRAMUSCULAR | Status: DC | PRN

## 2024-03-24 MED ORDER — FLUOXETINE HCL 20 MG PO CAPS
20.0000 mg | ORAL_CAPSULE | Freq: Every day | ORAL | Status: DC
  Administered 2024-03-24 – 2024-03-28 (×5): 20 mg via ORAL
  Filled 2024-03-24 (×6): qty 1

## 2024-03-24 MED ORDER — FLUOXETINE HCL 20 MG/5ML PO SOLN
20.0000 mg | Freq: Every day | ORAL | Status: DC
  Filled 2024-03-24: qty 5

## 2024-03-24 MED ORDER — HALOPERIDOL 5 MG PO TABS: 5.0000 mg | ORAL_TABLET | Freq: Three times a day (TID) | ORAL | Status: DC | PRN

## 2024-03-24 MED ORDER — ALUM & MAG HYDROXIDE-SIMETH 200-200-20 MG/5ML PO SUSP
30.0000 mL | ORAL | Status: DC | PRN
Start: 2024-03-24 — End: 2024-03-28

## 2024-03-24 MED ORDER — ASPIRIN 81 MG PO TBEC: 81.0000 mg | DELAYED_RELEASE_TABLET | Freq: Every day | ORAL | Status: DC

## 2024-03-24 MED ORDER — DIPHENHYDRAMINE HCL 25 MG PO CAPS: 50.0000 mg | ORAL_CAPSULE | Freq: Three times a day (TID) | ORAL | Status: DC | PRN

## 2024-03-24 MED ORDER — HALOPERIDOL LACTATE 5 MG/ML IJ SOLN
10.0000 mg | Freq: Three times a day (TID) | INTRAMUSCULAR | Status: DC | PRN
Start: 2024-03-24 — End: 2024-03-28

## 2024-03-24 NOTE — ED Notes (Signed)
 Pt visitor came back with pt from the lobby. Pt allowed to visit within visiting hours. Pt family made aware of quad rules. Visiting time of 15 mins. Pt given quad rules to take home.

## 2024-03-24 NOTE — ED Triage Notes (Signed)
 Patient states I'm biploar and I don't think my chemicals are balanced. Patient reports SI but denies HI.

## 2024-03-24 NOTE — Consult Note (Signed)
 [facility] Psychiatric Consult Initial  Patient Name: .Bruce Chambers.  MRN: [id number]  DOB: 1961-04-06  Consult Order details:  Orders (From admission, onward)     Start     Ordered   03/24/24 1329  CONSULT TO CALL ACT TEAM       Ordering Provider: Jacolyn Pae, MD  Provider:  (Not yet assigned)  Question:  Reason for Consult?  Answer:  Psych consult   03/24/24 1328   03/24/24 1329  IP CONSULT TO PSYCHIATRY       Ordering Provider: Jacolyn Pae, MD  Provider:  (Not yet assigned)  Question Answer Comment  Reason for consult: Other (see comments)   Comments: SI      03/24/24 1328             Mode of Visit: In person    Psychiatry Consult Evaluation  Service Date: March 24, 2024 LOS:  LOS: 0 days  Chief Complaint Suicidal ideation   Primary Psychiatric Diagnoses  Bipolar disorder, current episode depressed with suicidal ideation    Assessment  Bruce W Escher Harr. is a 63 y.o. male admitted: Presented to the ED Bruce W Cosens Jr. is a 63 y.o. male with a history of hypertension, bipolar disorder who comes ED complaining of feeling like his chemicals are out of balance.  Reports SI, no self-injurious behaviors thus far.  Denies any pain or fever. Psychiatry was consulted for safety evaluation.   On assessment, patient reports worsening episode of depression in the context of bipolar disorder. He endorses SI with plan, describing plan to hang himself with a rope. He denies HI/plan and denies hallucinations. We recommend inpatient psychiatric admission. Patient is agreeable to this.   Diagnoses:  Active Hospital problems: Active Problems:   * No active hospital problems. *    Plan   ## Psychiatric Medication Recommendations:  Will await EKG prior to starting psychotropic medication  ## Medical Decision Making Capacity: Not specifically addressed in this encounter  ## Further Work-up:   -- EKG pending -- Pertinent labwork reviewed earlier this  admission includes: CMP, CBC, blood toxicology screen   ## Disposition:-- We recommend inpatient psychiatric hospitalization when medically cleared. Patient is under voluntary admission status at this time; please IVC if attempts to leave hospital.  ## Behavioral / Environmental: -Utilize compassion and acknowledge the patient's experiences while setting clear and realistic expectations for care.    ## Safety and Observation Level:  - Based on my clinical evaluation, I estimate the patient to be at moderate risk of self harm in the current setting. - At this time, we recommend  routine. This decision is based on my review of the chart including patient's history and current presentation, interview of the patient, mental status examination, and consideration of suicide risk including evaluating suicidal ideation, plan, intent, suicidal or self-harm behaviors, risk factors, and protective factors. This judgment is based on our ability to directly address suicide risk, implement suicide prevention strategies, and develop a safety plan while the patient is in the clinical setting. Please contact our team if there is a concern that risk level has changed.  CSSR Risk Category:C-SSRS RISK CATEGORY: Moderate Risk  Suicide Risk Assessment: Patient has following modifiable risk factors for suicide: access to guns, active suicidal ideation, and active mental illness (to encompass adhd, tbi, mania, psychosis, trauma reaction), which we are addressing by recommending inpatient psychiatric admission. Patient has following non-modifiable or demographic risk factors for suicide: male gender, history of suicide attempt,  and psychiatric hospitalization Patient has the following protective factors against suicide: Access to outpatient mental health care and Supportive family  Thank you for this consult request. Recommendations have been communicated to the primary team.  We will recommend inpatient psychiatric  admission at this time.   Irie Fiorello LITTIE Lukes, PA-C       History of Present Illness  Relevant Aspects of Hospital ED   Patient Report:  Patient reports history of bipolar disorder diagnosed at age 56 years old.  He states he was previously doing well on his current medication regimen of Wellbutrin  and Lamictal , but reports worsening depressive episode with suicidal ideation over the past 3-4 weeks.  He reports active suicidal ideation with plan to hang himself with a rope.  He states he went to [facility] today who recommended that he come to the ED.  He denies HI/plan and denies hallucinations. He denies current manic symptoms. Patient reports exercising regularly, stating that he used to run but now due to pain in his knees uses an elliptical.  He is sleeping well, though states he sometimes uses sleep as an escape.  He reports a previous suicide attempt in 2002 during which he attempted to hang himself with an electrical cord. He recently received two steroid injections in his knees.  He states he was previously prescribed gabapentin for knee pain but reportedly stopped it due to it making him not feel well.  He states he last saw an outpatient psychiatrist about 20 years ago.  Psychiatric medications are currently managed by his primary care provider.  He reports recent stent placement [facility] heart at [facility].  He reports 3-4 previous psychiatric hospitalizations for suicidal ideation and depression.  Patient reports history of medical issues including heart issues, high cholesterol, elevated blood pressure, neuropathy.  Previous surgeries include hip replacement and back surgery.  He states he is not currently taking anything for high blood pressure.  He reports having muscle pains with statins.  Psych ROS:  Depression: Admits  Anxiety:  Denies  Mania (lifetime and current): Diagnosed with bipolar disorder at age 64 Psychosis: (lifetime and current): Denies   Psychiatric and Social History  Psychiatric  History:  Information collected from the patient.  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 Vital Signs:  Temp:  [98.1 F (36.7 C)] 98.1 F (36.7 C) (09/07 1240) Pulse Rate:  [79] 79 (09/07 1240) Resp:  [18] 18 (09/07 1240) BP: (171)/(96) 171/96 (09/07 1240) SpO2:  [99 %] 99 % (09/07 1240) Weight:  [90.7 kg] 90.7 kg (09/07 1241) Blood pressure (!) 171/96, pulse 79, temperature 98.1 F (36.7 C), resp. rate 18, height 6' (1.829 m), weight 90.7 kg, SpO2 99%. Body mass index is 27.12 kg/m.    Mental Status Exam: General Appearance: Casual  Orientation:  Full (  Time, Place, and Person)  Memory:  Immediate;   Good Recent;   Good Remote;   Good  Concentration:  Concentration: Good and Attention Span: Good  Recall:  Good  Attention  Good  Eye Contact:  Good  Speech:  Clear and Coherent  Language:  Good  Volume:  Normal  Mood: depressed  Affect:  Congruent  Thought Process:  Coherent  Thought Content:  Logical  Suicidal Thoughts:  Yes.  with intent/plan  Homicidal Thoughts:  No  Judgement:  Good  Insight:  Good   Psychomotor Activity:  Normal  Akathisia:  No  Fund of Knowledge:  Good      Assets:  Communication Skills Desire for Improvement Financial Resources/Insurance Housing Social Support Vocational/Educational  Cognition:  WNL  ADL's:  Intact  AIMS (if indicated):        Other History   These have been pulled in through the EMR, reviewed, and updated if appropriate.  Family History:  The patient's family history includes Atrial fibrillation in his father and sister; Emphysema in his mother.  Medical History: Past Medical History:  Diagnosis Date   Bipolar 1 disorder (HCC)    Dysplastic nevus 11/29/2023   Right infrascapular, moderate atypia   GERD (gastroesophageal reflux disease)    Hypertension    Psoriasis    PVC (premature ventricular contraction)     Surgical History: History reviewed. No pertinent surgical history.   Medications:  No current facility-administered medications for this encounter.  Current Outpatient Medications:    Adalimumab -adaz (HYRIMOZ ) 40 MG/0.4ML SOAJ, Inject 1 Pen into the skin every 14 (fourteen) days., Disp: 0.8 mL, Rfl: 5   aspirin  EC 81 MG tablet, Take 81 mg by mouth daily., Disp: , Rfl:    buPROPion  (WELLBUTRIN  XL) 150 MG 24 hr tablet, Take 1 tablet by mouth daily., Disp: , Rfl:    esomeprazole (NEXIUM) 20 MG capsule, Take 20 mg by mouth daily at 12 noon., Disp: , Rfl:    lamoTRIgine  (LAMICTAL ) 100 MG tablet, , Disp: , Rfl:    lamoTRIgine  (LAMICTAL ) 150 MG tablet, Take 75 mg by mouth daily., Disp: , Rfl:    Roflumilast  (ZORYVE ) 0.3 % CREA, Apply topically to affected areas of psoriasis as needed daily, Disp: 60 g, Rfl: 3  Allergies: Allergies  Allergen Reactions   Rosuvastatin Other (See Comments)    Camelia LITTIE Lukes, PA-C

## 2024-03-24 NOTE — ED Provider Notes (Addendum)
 [facility] Provider Note    Event Date/Time   First MD Initiated Contact with Patient 03/24/24 1519     (approximate)   History   Chief Complaint: Psychiatric Evaluation   HPI  Djimon W Yitzhak Awan. is a 63 y.o. male with a history of hypertension, bipolar disorder who comes ED complaining of feeling like his chemicals are out of balance.  Reports SI, no self-injurious behaviors thus far.  Denies any pain or fever.     Physical Exam   Triage Vital Signs: ED Triage Vitals  Encounter Vitals Group     BP 03/24/24 1240 (!) 171/96     Girls Systolic BP Percentile --      Girls Diastolic BP Percentile --      Boys Systolic BP Percentile --      Boys Diastolic BP Percentile --      Pulse Rate 03/24/24 1240 79     Resp 03/24/24 1240 18     Temp 03/24/24 1240 98.1 F (36.7 C)     Temp src --      SpO2 03/24/24 1240 99 %     Weight 03/24/24 1241 200 lb (90.7 kg)     Height 03/24/24 1241 6' (1.829 m)     Head Circumference --      Peak Flow --      Pain Score 03/24/24 1240 0     Pain Loc --      Pain Education --      Exclude from Growth Chart --     Most recent vital signs: Vitals:   03/24/24 1240  BP: (!) 171/96  Pulse: 79  Resp: 18  Temp: 98.1 F (36.7 C)  SpO2: 99%    General: Awake, no distress. CV:  Good peripheral perfusion.  Resp:  Normal effort.  Abd:  No distention.  Other:  No wounds   ED Results / Procedures / Treatments   Labs (all labs ordered are listed, but only abnormal results are displayed) Labs Reviewed  COMPREHENSIVE METABOLIC PANEL WITH GFR - Abnormal; Notable for the following components:      Result Value   Glucose, Bld 105 (*)    All other components within normal limits  CBC - Abnormal; Notable for the following components:   Hemoglobin 17.9 (*)    All other components within normal limits  ETHANOL  URINE DRUG SCREEN, QUALITATIVE ([facility] ONLY)  URINALYSIS, ROUTINE W REFLEX MICROSCOPIC      EKG Interpreted by me Normal sinus rhythm rate of 80.  Normal axis and intervals.  No acute ischemic changes.   RADIOLOGY    PROCEDURES:  Procedures   MEDICATIONS ORDERED IN ED: Medications - No data to display   IMPRESSION / MDM / ASSESSMENT AND PLAN / ED COURSE  I reviewed the triage vital signs and the nursing notes.  Patient's presentation is most consistent with severe exacerbation of chronic illness.  Patient presents with worsening bipolar disorder symptoms with SI.  He is medically stable.  Evaluated by psychiatry and recommended for inpatient treatment.  The patient has been placed in psychiatric observation due to the need to provide a safe environment for the patient while obtaining psychiatric consultation and evaluation, as well as ongoing medical and medication management to treat the patient's condition.  The patient has not been placed under full IVC at this time.      FINAL CLINICAL IMPRESSION(S) / ED DIAGNOSES   Final diagnoses:  Bipolar affective disorder, remission  status unspecified (HCC)     Rx / DC Orders   ED Discharge Orders     None        Note:  This document was prepared using Dragon voice recognition software and may include unintentional dictation errors.   Viviann Pastor, MD 03/24/24 VALORIE    Viviann Pastor, MD 03/24/24 [phone]

## 2024-03-24 NOTE — ED Notes (Signed)
Pt given meal tray and beverage at this time

## 2024-03-24 NOTE — ED Notes (Signed)
 Pt given dinner tray.

## 2024-03-24 NOTE — Plan of Care (Signed)
 Patient is a voluntary admission to [facility] for [facility] - Bipolar requesting medication adjustment.  Patient denies SI, HI, AVH, endorses anxiety 6/10 and depression 7/10. Last admission was 20 years ago.  He recently had medical procedures and feels something affected the medication.  Patient is calm, cooperative and friendly with staff.  Will continue to monitor.

## 2024-03-24 NOTE — ED Notes (Signed)
 Patient is vol pending placement

## 2024-03-24 NOTE — Group Note (Signed)
 Date:  03/24/2024 Time:  9:26 PM  Group Topic/Focus:  Wrap-Up Group:   The focus of this group is to help patients review their daily goal of treatment and discuss progress on daily workbooks.    Participation Level:  Active  Participation Quality:  Appropriate  Affect:  Appropriate  Cognitive:  Alert  Insight: Appropriate  Engagement in Group:  Engaged  Modes of Intervention:  Discussion  Additional Comments:    Bruce CHRISTELLA Chambers 03/24/2024, 9:26 PM

## 2024-03-24 NOTE — BH Assessment (Signed)
 Comprehensive Clinical Assessment (CCA) Note  03/24/2024 Bruce Chambers [id number]  Chief Complaint:  Chief Complaint  Patient presents with   Psychiatric Evaluation   Visit Diagnosis: Bipolar   Bruce Enyeart. is a 63 year old male who presents to the ER because he is no longer able to manage his symptoms of Bipolar. Patient is currently experience depression and described it as "going into a black hole." He states, he is feeling "blink" and the last time he has felt this way was approximately twenty-two years ago. He is currently having thoughts of ending his life, and his last attempted was over twenty years ago when he hung his self. Patient further shared, he has had two medical procedures that required him to be under anesthesia, and believes have a major caused in the change. As well as him starting gabapentin for neuropathy. Patient physical health has improved. He currently lives with his wife and she voiced her concerns about the change in his mood. Other factors include his father passing away in November last year. The father lived with the patient for approximately five years, before his passing. The patient's mother was also dx with bipolar and per his report, it wasn't managed well.  During the interview, he was calm, cooperative and pleasant. He was able to provide appropriate answers to the questions. He denies HI and AV/H. He denies the use of any illicit drugs or substances. He denies any involvement with the legal system and no history of violence. Patient is able to complete his ADL's without assistance.   CCA Screening, Triage and Referral (STR)  Patient Reported Information How did you hear about us ? Self  What Is the Reason for Your Visit/Call Today? Patient brought to the ER because he is no longer able to manage his symptoms of Bipolar.  How Long Has This Been Causing You Problems? 1 wk - 1 month  What Do You Feel Would Help You the Most Today? Treatment for  Depression or other mood problem   Have You Recently Had Any Thoughts About Hurting Yourself? Yes  Are You Planning to Commit Suicide/Harm Yourself At This time? No   Flowsheet Row ED from 03/24/2024 in [facility] Emergency Department at [facility]  C-SSRS RISK CATEGORY Moderate Risk    Have you Recently Had Thoughts About Hurting Someone [place]? No  Are You Planning to Harm Someone at This Time? No  Explanation: No data recorded  Have You Used Any Alcohol or Drugs in the Past 24 Hours? No  How Long Ago Did You Use Drugs or Alcohol? No data recorded What Did You Use and How Much? No data recorded  Do You Currently Have a Therapist/Psychiatrist? No  Name of Therapist/Psychiatrist:    Have You Been Recently Discharged From Any Office Practice or Programs? No  Explanation of Discharge From Practice/Program: No data recorded    CCA Screening Triage Referral Assessment Type of Contact: Face-to-Face  Telemedicine Service Delivery:   Is this Initial or Reassessment?   Date Telepsych consult ordered in [facility]:    Time Telepsych consult ordered in [facility]:    Location of Assessment: [facility] ED  Provider Location: [facility] ED   Collateral Involvement: No data recorded  Does Patient Have a Court Appointed Legal Guardian? No  Legal Guardian Contact Information: No data recorded Copy of Legal Guardianship Form: No data recorded Legal Guardian Notified of Arrival: No data recorded Legal Guardian Notified of Pending Discharge: No data recorded If Minor and Not Living  with Parent(s), Who has Custody? No data recorded Is CPS involved or ever been involved? Never  Is APS involved or ever been involved? Never   Patient Determined To Be At Risk for Harm To Self or Others Based on Review of Patient Reported Information or Presenting Complaint? Yes, for Self-Harm  Method: No data recorded Availability of Means: No data recorded Intent: No data recorded Notification Required: No data  recorded Additional Information for Danger to Others Potential: No data recorded Additional Comments for Danger to Others Potential: No data recorded Are There Guns or Other Weapons in Your Home? No  Types of Guns/Weapons: No data recorded Are These Weapons Safely Secured?                            No data recorded Who Could Verify You Are Able To Have These Secured: No data recorded Do You Have any Outstanding Charges, Pending Court Dates, Parole/Probation? No data recorded Contacted To Inform of Risk of Harm To Self or Others: No data recorded   Does Patient Present under Involuntary Commitment? No   [place] of Residence: [place]   Patient Currently Receiving the Following Services: Not Receiving Services   Determination of Need: Emergent (2 hours)   Options For Referral: Inpatient Hospitalization; ED Visit     CCA Biopsychosocial Patient Reported Schizophrenia/Schizoaffective Diagnosis in Past: No   Strengths: Have insight, seeking help and have a support system.   Mental Health Symptoms Depression:  Change in energy/activity; Hopelessness   Duration of Depressive symptoms: Duration of Depressive Symptoms: Greater than two weeks   Mania:  None   Anxiety:   Restlessness   Psychosis:  None   Duration of Psychotic symptoms:    Trauma:  None   Obsessions:  N/A   Compulsions:  N/A   Inattention:  N/A   Hyperactivity/Impulsivity:  N/A   Oppositional/Defiant Behaviors:  N/A   Emotional Irregularity:  N/A   Other Mood/Personality Symptoms:  No data recorded   Mental Status Exam Appearance and self-care  Stature:  Average   Weight:  Average weight   Clothing:  Neat/clean; Age-appropriate   Grooming:  Normal   Cosmetic use:  None   Posture/gait:  Normal   Motor activity:  -- (Within normal range)   Sensorium  Attention:  Normal   Concentration:  Normal   Orientation:  X5   Recall/memory:  Normal   Affect and Mood  Affect:   Depressed   Mood:  Depressed   Relating  Eye contact:  Normal   Facial expression:  Depressed; Responsive; Sad   Attitude toward examiner:  Cooperative   Thought and Language  Speech flow: Clear and Coherent   Thought content:  Appropriate to Mood and Circumstances   Preoccupation:  None   Hallucinations:  None   Organization:  Coherent; Goal-directed; Intact   [employer] of Knowledge:  Good   Intelligence:  Average   Abstraction:  Functional; Normal   Judgement:  Fair   Reality Testing:  Adequate   Insight:  Fair   Decision Making:  Normal   Social Functioning  Social Maturity:  Responsible   Social Judgement:  Normal   Stress  Stressors:  Work   Coping Ability:  Normal   Skill Deficits:  None   Supports:  Family; Friends/Service system     Religion: Religion/Spirituality Are You A Religious Person?: No  Leisure/Recreation: Leisure / Recreation Do You Have Hobbies?: Yes  Leisure and Hobbies: Working out  Exercise/Diet: Exercise/Diet Do You Exercise?: Yes What Type of Exercise Do You Do?: Run/Walk, Weight Training How Many Times a Week Do You Exercise?: 1-3 times a week Have You Gained or Lost A Significant Amount of Weight in the Past Six Months?: No Do You Follow a Special Diet?: No Do You Have Any Trouble Sleeping?: No   CCA Employment/Education Employment/Work Situation: Employment / Work Situation Employment Situation: Employed Patient's Job has Been Impacted by Current Illness: No Has Patient ever Been in Equities trader?: No  Education: Education Is Patient Currently Attending School?: No Did You Have An Individualized Education Program (IIEP): No Did You Have Any Difficulty At [employer]?: No Patient's Education Has Been Impacted by Current Illness: No   CCA Family/Childhood History Family and Relationship History: Family history Marital status: Married Does patient have children?: Yes  Childhood History:   Childhood History By whom was/is the patient raised?: Both parents Did patient suffer any verbal/emotional/physical/sexual abuse as a child?: No Did patient suffer from severe childhood neglect?: No Has patient ever been sexually abused/assaulted/raped as an adolescent or adult?: No Was the patient ever a victim of a crime or a disaster?: No Witnessed domestic violence?: No Has patient been affected by domestic violence as an adult?: No       CCA Substance Use Alcohol/Drug Use: Alcohol / Drug Use Pain Medications: See MAR Prescriptions: See MAR Over the Counter: See MAR History of alcohol / drug use?: No history of alcohol / drug abuse Longest period of sobriety (when/how long): n/a   ASAM's:  Six Dimensions of Multidimensional Assessment  Dimension 1:  Acute Intoxication and/or Withdrawal Potential:      Dimension 2:  Biomedical Conditions and Complications:      Dimension 3:  Emotional, Behavioral, or Cognitive Conditions and Complications:     Dimension 4:  Readiness to Change:     Dimension 5:  Relapse, Continued use, or Continued Problem Potential:     Dimension 6:  Recovery/Living Environment:     ASAM Severity Score:    ASAM Recommended Level of Treatment:     Substance use Disorder (SUD)    Recommendations for Services/Supports/Treatments:    Disposition Recommendation per psychiatric provider: Inpatient Treatment   DSM5 Diagnoses: Patient Active Problem List   Diagnosis Date Noted   Unilateral primary osteoarthritis, right knee 01/15/2024   Unilateral primary osteoarthritis, left knee 01/15/2024   Pure hypercholesterolemia 11/06/2023   Idiopathic peripheral neuropathy 11/06/2023   Coronary artery disease of native artery of native heart with stable angina pectoris (HCC) 09/27/2022   Spinal stenosis of lumbar region with neurogenic claudication 09/07/2022   Psoriasis 09/07/2022   Acid reflux 03/06/2018   BP (high blood pressure) 03/06/2018   Brash  03/06/2018   Dermatitis, eczematoid 03/06/2018   HLD (hyperlipidemia) 03/06/2018   Acute lower GI bleeding 02/05/2015   Leg pain 07/02/2014   Low back pain with sciatica 07/02/2014   Numbness and tingling 07/02/2014     Referrals to Alternative Service(s): Referred to Alternative Service(s):   Place:   Date:   Time:    Referred to Alternative Service(s):   Place:   Date:   Time:    Referred to Alternative Service(s):   Place:   Date:   Time:    Referred to Alternative Service(s):   Place:   Date:   Time:     Kiki DOROTHA Barge MS, LCAS, [facility], [facility] Therapeutic Triage Specialist 03/24/2024 3:01 PM

## 2024-03-24 NOTE — ED Notes (Signed)
 Pt family member took pt belongings with her.

## 2024-03-24 NOTE — Progress Notes (Addendum)
   03/24/24 2000  Psych Admission Type (Psych Patients Only)  Admission Status Voluntary  Psychosocial Assessment  Patient Complaints Anxiety  Eye Contact Fair  Facial Expression Animated  Affect Appropriate to circumstance  Speech Logical/coherent  Interaction Assertive  Motor Activity Slow  Appearance/Hygiene Unremarkable  Behavior Characteristics Cooperative;Anxious  Mood Anxious;Pleasant  Thought Process  Coherency WDL  Content WDL  Delusions None reported or observed  Perception WDL  Hallucination None reported or observed  Judgment WDL  Confusion None  Danger to Self  Current suicidal ideation? Denies  Danger to Others  Danger to Others None reported or observed   Progress note   D: Pt seen in dayroom visiting with his wife. Pt denies SI, HI, AVH. Pt rates pain  7/10 in right knee d/t missing cartilage. Refused medication for pain. Pt says he was managing his bipolar disorder for 10 years without medication. Then began taking medication and hasn't had a med change in all that time. Says 6-8 weeks ago, he had a cardiac cath and a nodal ablation. States he needs a right knee replacement. Pt is healthy. He was an avid runner until his knee problems. Wants to get mobility back and make sure his heart is good. Pt a bit anxious about being in an inpatient setting as this is his first time in such circumstances.No other concerns noted at this time.  A: Pt provided support and encouragement. Pt given scheduled medication as prescribed. PRNs as appropriate. Q15 min checks for safety.   R: Pt safe on the unit. Will continue to monitor.

## 2024-03-24 NOTE — ED Notes (Addendum)
 Pt stating that he feels his medication has stopped working. Pt takes Wellbutrin  and Lamictal  for his Bipolar disorder. Pt states he has felt different over the past 4-6 weeks with increased depression and SI. Pt [facility] go to [facility] but [facility] stated his dx was too acute for them to treat so they recommended coming to the ER. Pt would like to receive psychiatric services.

## 2024-03-25 DIAGNOSIS — F3181 Bipolar II disorder: Principal | ICD-10-CM | POA: Diagnosis present

## 2024-03-25 LAB — CBC
HCT: 44.8 % (ref 39.0–52.0)
Hemoglobin: 15.5 g/dL (ref 13.0–17.0)
MCH: 31.6 pg (ref 26.0–34.0)
MCHC: 34.6 g/dL (ref 30.0–36.0)
MCV: 91.2 fL (ref 80.0–100.0)
Platelets: 223 K/uL (ref 150–400)
RBC: 4.91 MIL/uL (ref 4.22–5.81)
RDW: 13.1 % (ref 11.5–15.5)
WBC: 7.8 K/uL (ref 4.0–10.5)
nRBC: 0 % (ref 0.0–0.2)

## 2024-03-25 LAB — HEPATITIS PANEL, ACUTE
HCV Ab: NONREACTIVE
Hep A IgM: NONREACTIVE
Hep B C IgM: NONREACTIVE
Hepatitis B Surface Ag: NONREACTIVE

## 2024-03-25 LAB — TSH: TSH: 1.192 u[IU]/mL (ref 0.350–4.500)

## 2024-03-25 LAB — LIPID PANEL
Cholesterol: 107 mg/dL (ref 0–200)
HDL: 43 mg/dL (ref >40)
LDL Cholesterol: 46 mg/dL (ref 0–99)
Total CHOL/HDL Ratio: 2.5 ratio
Triglycerides: 90 mg/dL (ref <150)
VLDL: 18 mg/dL (ref 0–40)

## 2024-03-25 LAB — RPR: RPR Ser Ql: NONREACTIVE

## 2024-03-25 LAB — HEMOGLOBIN A1C
Hgb A1c MFr Bld: 5.2 % (ref 4.8–5.6)
Mean Plasma Glucose: 102.54 mg/dL

## 2024-03-25 MED ORDER — LAMOTRIGINE 100 MG PO TABS
100.0000 mg | ORAL_TABLET | Freq: Every day | ORAL | Status: DC
Start: 2024-03-25 — End: 2024-03-28
  Administered 2024-03-25 – 2024-03-28 (×4): 100 mg via ORAL
  Filled 2024-03-25 (×4): qty 1

## 2024-03-25 MED ORDER — BUPROPION HCL ER (XL) 150 MG PO TB24: 150.0000 mg | ORAL_TABLET | Freq: Every day | ORAL | Status: DC

## 2024-03-25 MED ORDER — BUPROPION HCL ER (XL) 300 MG PO TB24
300.0000 mg | ORAL_TABLET | Freq: Every day | ORAL | Status: DC
  Administered 2024-03-25 – 2024-03-28 (×4): 300 mg via ORAL
  Filled 2024-03-25 (×4): qty 1

## 2024-03-25 NOTE — Group Note (Signed)
 Recreation Therapy Group Note   Group Topic:Leisure Education  Group Date: 03/25/2024 Start Time: 1400 End Time: 1500 Facilitators: Celestia Jeoffrey BRAVO, LRT, CTRS Location: Courtyard  Group Description: Music. Patients encouraged to name their favorite song(s) for LRT to play song through speaker for group to hear, while in the courtyard getting fresh air and sunlight. Patients educated on the definition of leisure and the importance of having different leisure interests outside of the hospital. Group discussed how leisure activities can often be used as Pharmacologist and that listening to music and being outside are examples.    Goal Area(s) Addressed:  Patient will identify a current leisure interest.  Patient will practice making a positive decision. Patient will have the opportunity to try a new leisure activity.   Affect/Mood: Appropriate   Participation Level: Active   Participation Quality: Independent   Behavior: Calm and Cooperative   Speech/Thought Process: Coherent   Insight: Moderate   Judgement: Fair    Modes of Intervention: Guided Discussion, Music, Socialization, and Support   Patient Response to Interventions:  Attentive and Receptive   Education Outcome:  Acknowledges education   Clinical Observations/Individualized Feedback: Bruce Chambers was active in their participation of session activities and group discussion. Pt chose to do yoga while listening to the music being played. Pt interacted well with LRT and peers duration of session.    Plan: Continue to engage patient in RT group sessions 2-3x/week.   Jeoffrey BRAVO Celestia, LRT, CTRS 03/25/2024 5:06 PM

## 2024-03-25 NOTE — Progress Notes (Signed)
 Mood/ affect:  Pleasant and cooperative.     Psych assessment:  Endorses depression. Denies SI/HI and AVH.    Behavior:  Present in the milieu.  Appropriate interaction with peers and staff.  Attended 2/3 groups.  Medication/ PRNs:  Compliant with scheduled medications. No PRNs needed.   Pain: Denies  15 min checks in place for safety.

## 2024-03-25 NOTE — BH IP Treatment Plan (Signed)
 Interdisciplinary Treatment and Diagnostic Plan Update  03/25/2024 Time of Session: 10:12PM Bruce Chambers. MRN: [id number]  Principal Diagnosis: Bipolar affect, depressed (HCC)  Secondary Diagnoses: Principal Problem:   Bipolar affect, depressed (HCC)   Current Medications:  Current Facility-Administered Medications  Medication Dose Route Frequency Provider Last Rate Last Admin   acetaminophen  (TYLENOL ) tablet 650 mg  650 mg Oral Q6H PRN Shrivastava, Aryendra, MD       alum & mag hydroxide-simeth (MAALOX/MYLANTA) 200-200-20 MG/5ML suspension 30 mL  30 mL Oral Q4H PRN Shrivastava, Aryendra, MD       haloperidol  (HALDOL ) tablet 5 mg  5 mg Oral TID PRN Shrivastava, Aryendra, MD       And   diphenhydrAMINE  (BENADRYL ) capsule 50 mg  50 mg Oral TID PRN Shrivastava, Aryendra, MD       haloperidol  lactate (HALDOL ) injection 5 mg  5 mg Intramuscular TID PRN Shrivastava, Aryendra, MD       And   diphenhydrAMINE  (BENADRYL ) injection 50 mg  50 mg Intramuscular TID PRN Shrivastava, Aryendra, MD       And   LORazepam  (ATIVAN ) injection 2 mg  2 mg Intramuscular TID PRN Shrivastava, Aryendra, MD       haloperidol  lactate (HALDOL ) injection 10 mg  10 mg Intramuscular TID PRN Shrivastava, Aryendra, MD       And   diphenhydrAMINE  (BENADRYL ) injection 50 mg  50 mg Intramuscular TID PRN Shrivastava, Aryendra, MD       And   LORazepam  (ATIVAN ) injection 2 mg  2 mg Intramuscular TID PRN Shrivastava, Aryendra, MD       FLUoxetine  (PROZAC ) capsule 20 mg  20 mg Oral Daily Jadapalle, Sree, MD   20 mg at 03/24/24 1848   magnesium  hydroxide (MILK OF MAGNESIA) suspension 30 mL  30 mL Oral Daily PRN Shrivastava, Aryendra, MD       OLANZapine  (ZYPREXA ) tablet 5 mg  5 mg Oral BID Shrivastava, Aryendra, MD   5 mg at 03/24/24 2023   pantoprazole  (PROTONIX ) EC tablet 40 mg  40 mg Oral Daily Shrivastava, Aryendra, MD       PTA Medications: Medications Prior to Admission  Medication Sig Dispense Refill Last  Dose/Taking   Adalimumab -adaz (HYRIMOZ ) 40 MG/0.4ML SOAJ Inject 1 Pen into the skin every 14 (fourteen) days. 0.8 mL 5    aspirin  EC 81 MG tablet Take 81 mg by mouth daily.      buPROPion  (WELLBUTRIN  XL) 150 MG 24 hr tablet Take 1 tablet by mouth daily.      esomeprazole (NEXIUM) 20 MG capsule Take 20 mg by mouth daily at 12 noon.      lamoTRIgine  (LAMICTAL ) 100 MG tablet       lamoTRIgine  (LAMICTAL ) 150 MG tablet Take 75 mg by mouth daily.      Roflumilast  (ZORYVE ) 0.3 % CREA Apply topically to affected areas of psoriasis as needed daily 60 g 3     Patient Stressors:    Patient Strengths:    Treatment Modalities: Medication Management, Group therapy, Case management,  1 to 1 session with clinician, Psychoeducation, Recreational therapy.   Physician Treatment Plan for Primary Diagnosis: Bipolar affect, depressed (HCC) Long Term Goal(s):     Short Term Goals:    Medication Management: Evaluate patient's response, side effects, and tolerance of medication regimen.  Therapeutic Interventions: 1 to 1 sessions, Unit Group sessions and Medication administration.  Evaluation of Outcomes: Not Met  Physician Treatment Plan for Secondary Diagnosis: Principal Problem:  Bipolar affect, depressed (HCC)  Long Term Goal(s):     Short Term Goals:       Medication Management: Evaluate patient's response, side effects, and tolerance of medication regimen.  Therapeutic Interventions: 1 to 1 sessions, Unit Group sessions and Medication administration.  Evaluation of Outcomes: Not Met   RN Treatment Plan for Primary Diagnosis: Bipolar affect, depressed (HCC) Long Term Goal(s): Knowledge of disease and therapeutic regimen to maintain health will improve  Short Term Goals: Ability to demonstrate self-control, Ability to participate in decision making will improve, Ability to verbalize feelings will improve, Ability to disclose and discuss suicidal ideas, Ability to identify and develop  effective coping behaviors will improve, and Compliance with prescribed medications will improve  Medication Management: RN will administer medications as ordered by provider, will assess and evaluate patient's response and provide education to patient for prescribed medication. RN will report any adverse and/or side effects to prescribing provider.  Therapeutic Interventions: 1 on 1 counseling sessions, Psychoeducation, Medication administration, Evaluate responses to treatment, Monitor vital signs and CBGs as ordered, Perform/monitor CIWA, COWS, AIMS and Fall Risk screenings as ordered, Perform wound care treatments as ordered.  Evaluation of Outcomes: Not Met   LCSW Treatment Plan for Primary Diagnosis: Bipolar affect, depressed (HCC) Long Term Goal(s): Safe transition to appropriate next level of care at discharge, Engage patient in therapeutic group addressing interpersonal concerns.  Short Term Goals: Engage patient in aftercare planning with referrals and resources, Increase social support, Increase ability to appropriately verbalize feelings, Increase emotional regulation, Facilitate acceptance of mental health diagnosis and concerns, and Increase skills for wellness and recovery  Therapeutic Interventions: Assess for all discharge needs, 1 to 1 time with Social worker, Explore available resources and support systems, Assess for adequacy in community support network, Educate family and significant other(s) on suicide prevention, Complete Psychosocial Assessment, Interpersonal group therapy.  Evaluation of Outcomes: Not Met   Progress in Treatment: Attending groups: Yes. Participating in groups: Yes. Taking medication as prescribed: Yes. Toleration medication: Yes. Family/Significant other contact made: No, will contact:  once permission has been granted Patient understands diagnosis: Yes. Discussing patient identified problems/goals with staff: Yes. Medical problems stabilized or  resolved: Yes. Denies suicidal/homicidal ideation: Yes. Issues/concerns per patient self-inventory: No. Other: none  New problem(s) identified: No, Describe:  none  New Short Term/Long Term Goal(s):medication management for mood stabilization; elimination of SI thoughts; development of comprehensive mental wellness/sobriety plan  Patient Goals:  get my medication tweaked so I can get back to how I like to feel  Discharge Plan or Barriers: CSW to assist in the development of appropriate discharge plans.    Reason for Continuation of Hospitalization: Anxiety Depression Medication stabilization  Estimated Length of Stay:  1-7 days  Last 3 [place] Suicide Severity Risk Score: Flowsheet Row Admission (Current) from 03/24/2024 in [facility] BE[facility] Most recent reading at 03/24/2024  5:00 PM ED from 03/24/2024 in [facility] Emergency Department at [facility] Most recent reading at 03/24/2024  2:50 PM  C-SSRS RISK CATEGORY Moderate Risk Moderate Risk    Last PHQ 2/9 Scores:    11/06/2023    4:03 PM  Depression screen PHQ 2/9  Decreased Interest 0  Down, Depressed, Hopeless 0  PHQ - 2 Score 0    Scribe for Treatment Team: Sherryle JINNY Margo, LCSW 03/25/2024 10:18 AM

## 2024-03-25 NOTE — Group Note (Signed)
 Physical/Occupational Therapy Group Note  Group Topic: Functional, Dynamic Balance   Group Date: 03/25/2024 Start Time: 1302 End Time: 1332 Facilitators: Dajahnae Vondra, Alm Hamilton, PT   Group Description: Group discussed impact of balance on safety and independence with functional tasks.  Identified and discussed any self-perceived balance deficits to personalize information.  Discussed and reviewed strategies to address/improve balance deficits: use of assist devices, activity pacing/energy conservation, environment/home safety modifications, focusing attention/minimizing distraction.  Reviewed and participated with standing LE therex designed to target dynamic balance reactions and LE strength/stability; provided handouts with HEP to be utilized outside of group time as appropriate.  Allowed time for questions and further discussion on any balance or mobility concerns/needs.  Therapeutic Goal(s):  Identify and discuss any individual balance deficits and functional implications. Identify and discuss any environmental/home safety modifications that can optimize balance and safety for mobility within the home. Demonstrate understanding and performance of standing therex designed to target dynamic balance deficits.  Individual Participation: Did not attend  Participation Level:   Participation Quality:   Behavior:   Speech/Thought Process:   Affect/Mood:   Insight:   Judgement:   Modes of Intervention:   Patient Response to Interventions:    Plan: Continue to engage patient in PT/OT groups 1 - 2x/week.  CHARM Hamilton Bertin PT, DPT 03/25/24, 2:21 PM

## 2024-03-25 NOTE — Group Note (Signed)
 Date:  03/25/2024 Time:  11:05 AM  Group Topic/Focus:  Goals Group:   The focus of this group is to help patients establish daily goals to achieve during treatment and discuss how the patient can incorporate goal setting into their daily lives to aide in recovery.    Participation Level:  Active  Participation Quality:  Appropriate  Affect:  Appropriate  Cognitive:  Appropriate  Insight: Good and None  Engagement in Group:  Engaged  Modes of Intervention:  Activity  Additional Comments:  N/A  Harlene LITTIE Gavel 03/25/2024, 11:05 AM

## 2024-03-25 NOTE — Progress Notes (Signed)
(  Sleep Hours) - 7.5 (Any PRNs that were needed, meds refused, or side effects to meds)- none (Any disturbances and when (visitation, over night)- none (Concerns raised by the patient)- asked questions about new medications ordered. Hasn't had medication change for 20 years (SI/HI/AVH)- denies

## 2024-03-25 NOTE — H&P (Signed)
 Psychiatric Admission Assessment Adult  Patient Identification: Bruce Chambers. MRN:  [id number] Date of Evaluation:  03/25/2024 Chief Complaint:  Bipolar affect, depressed (HCC) [F31.30]   History of Present Illness: Patient is a 63 year old male with history of bipolar disorder. Patient reports being diagnosed with bipolar disorder at age 23 years old.  He states he was previously doing well on his current medication regimen of Wellbutrin  and Lamictal , but reports worsening depressive episode over the last 4-6 weeks. He denies current manic symptoms and states he has never had a true manic episode, but does endorse history of longer periods of increased energy and motivation. He reports anxiety related to his current depressed mental state but denies persistent worry about a variety of things. He denies history of physical or sexual abuse and denies nightmares or flashbacks.  Patient reports exercising regularly, though current joint pain has recently limited his activity level. He recently received two steroid injections in his knees.  He states he was previously prescribed gabapentin for knee pain but reportedly stopped it due feeling it might have been contributing to his depression.  He is sleeping well with approximately 8 hours of restful sleep per night. His appetite has decreased somewhat.  He reports a previous suicide attempt in 2002 during which he attempted to hang himself with an electrical cord, reporting the cord broke. He has had 3-4 past psychiatric hospitalizations in the past for depression and suicidal ideation.  He states he last saw an outpatient psychiatrist about 20 years ago.  Psychiatric medications are currently managed by his primary care provider.  He takes Wellbutrin  XL 300 mg once daily and Lamictal  75 mg, and states this regimen was previously effective for 20 years prior to this current depressive episode. He reports independently increasing his Wellbutrin  to 450 mg for  two weeks, then decreased it back to 300 mg after no improvement was noted at the higher dose. He denies side effects at present dose or 450 mg dose. He states he requested to try Prozac  when seen for psychiatric consult in the ED, due to his wife currently taking this medication and finding it effective. Patient reports recently started Zyprexa  has caused sedation and requests to discontinue this medication for this reason.   Per chart review, patient initially endorsed SI with plan when seen for consult in the ED. On interview today, he denies SI/HI/plan and denies hallucinations.   Collateral information provided by patient's wife, Bruce Chambers. She reports patient has not been himself for the past few months, with significant worsening of depression in the last few weeks. She states patient has told her that he does not want to harm himself. She confirms there is a gun in their home. Provider educated patient's wife about gun safety and removing the gun from the home, which patient's wife states she will do.   On assessment, patient is meeting criteria for bipolar II disorder. There is no clear evidence of history of a true manic episode. Family history if significant for bipolar disorder in his mother.  Total Time spent with patient: 1 hour Sleep  Sleep:Sleep: Good Number of Hours of Sleep: 8  Past Psychiatric History: bipolar disorder Psychiatric History:  Information collected from the patient and chart review.   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 Provider provided gun safety education to patient and his wife and advised wife to remove the gun from their home; wife verbalized that she will do so.    Substance History Alcohol: two glasses of wine per night   Type of alcohol wine Last Drink 1 week ago Number of drinks per day 2 History of alcohol withdrawal seizures denies  History of DT's denies  Tobacco: Unknown Illicit drugs: denies  Prescription drug abuse: denies  Rehab hx: denies  Is the patient at risk to self? No.  Has the patient been a risk to self in the past 6 months? Yes.    Has the patient been a risk to self within the distant past? Yes.    Is the patient a risk to others? No.  Has the patient been a risk to others in the past 6 months? No.  Has the patient been a risk to others within the distant past? No.   [place] Scale:  Flowsheet Row Admission (Current) from 03/24/2024 in [facility] Most recent reading at 03/24/2024  5:00 PM ED from 03/24/2024 in [facility] Emergency Department at [facility] Most recent reading at 03/24/2024  2:50 PM  C-SSRS RISK CATEGORY Moderate Risk Moderate Risk     Past Medical History:  Past Medical History:  Diagnosis Date   Bipolar 1 disorder (HCC)    Dysplastic nevus 11/29/2023   Right infrascapular, moderate atypia   GERD (gastroesophageal reflux disease)    Hypertension    Psoriasis    PVC (premature ventricular contraction)    History reviewed. No pertinent surgical history. Family History:  Family History  Problem Relation Age of Onset   Emphysema Mother    Atrial fibrillation Father    Atrial fibrillation Sister     Social History:  Social History   Substance  and Sexual Activity  Alcohol Use Yes   Alcohol/week: 2.0 standard drinks of alcohol   Types: 2 Glasses of wine per week     Social History   Substance and Sexual Activity  Drug Use Never      Allergies:   Allergies  Allergen Reactions   Rosuvastatin Other (See Comments)   Lab Results:  Results for orders placed or performed during the hospital encounter of 03/24/24 (from the past 48 hours)  Hemoglobin A1c     Status: None   Collection Time: 03/25/24  6:22 AM  Result Value Ref Range   Hgb A1c MFr Bld 5.2 4.8 - 5.6 %    Comment: (NOTE) Diagnosis of Diabetes The following HbA1c ranges recommended by the American Diabetes Association (ADA) may be used as an aid in the diagnosis of diabetes mellitus.  Hemoglobin             Suggested A1C NGSP%              Diagnosis  <5.7                   Non Diabetic  5.7-6.4  Pre-Diabetic  >6.4                   Diabetic  <7.0                   Glycemic control for                       adults with diabetes.     Mean Plasma Glucose 102.54 mg/dL    Comment: Performed at [facility], [address], [place], [place] [address]  CBC     Status: None   Collection Time: 03/25/24  6:22 AM  Result Value Ref Range   WBC 7.8 4.0 - 10.5 K/uL   RBC 4.91 4.22 - 5.81 MIL/uL   Hemoglobin 15.5 13.0 - 17.0 g/dL   HCT 55.1 60.9 - 47.9 %   MCV 91.2 80.0 - 100.0 fL   MCH 31.6 26.0 - 34.0 pg   MCHC 34.6 30.0 - 36.0 g/dL   RDW 86.8 88.4 - 84.4 %   Platelets 223 150 - 400 K/uL   nRBC 0.0 0.0 - 0.2 %    Comment: Performed at [facility], [address] [place], [place] [address]  Lipid panel     Status: None   Collection Time: 03/25/24  6:22 AM  Result Value Ref Range   Cholesterol 107 0 - 200 mg/dL   Triglycerides 90 <849 mg/dL   HDL 43 >59 mg/dL   Total CHOL/HDL Ratio 2.5 RATIO   VLDL 18 0 - 40 mg/dL   LDL Cholesterol 46 0 - 99 mg/dL    Comment:        Total Cholesterol/HDL:CHD Risk Coronary Heart Disease  Risk Table                     Men   Women  1/2 Average Risk   3.4   3.3  Average Risk       5.0   4.4  2 X Average Risk   9.6   7.1  3 X Average Risk  23.4   11.0        Use the calculated Patient Ratio above and the CHD Risk Table to determine the patient's CHD Risk.        ATP III CLASSIFICATION (LDL):  <100     mg/dL   Optimal  899-870  mg/dL   Near or Above                    Optimal  130-159  mg/dL   Borderline  839-810  mg/dL   High  >809     mg/dL   Very High Performed at [facility], [address] [place], [place] [address]   TSH     Status: None   Collection Time: 03/25/24  6:22 AM  Result Value Ref Range   TSH 1.192 0.350 - 4.500 uIU/mL    Comment: Performed by a 3rd Generation assay with a functional sensitivity of <=0.01 uIU/mL. Performed at [facility], [address] [place], [place] [address]   RPR     Status: None   Collection Time: 03/25/24  6:22 AM  Result Value Ref Range   RPR Ser Ql NON REACTIVE NON REACTIVE    Comment: Performed at [facility], [address] [place], [place] [address]    Blood Alcohol level:  Lab Results  Component Value Date  ETH <15 03/24/2024    Metabolic Disorder Labs:  Lab Results  Component Value Date   HGBA1C 5.2 03/25/2024   MPG 102.54 03/25/2024   No results found for: PROLACTIN Lab Results  Component Value Date   CHOL 107 03/25/2024   TRIG 90 03/25/2024   HDL 43 03/25/2024   CHOLHDL 2.5 03/25/2024   VLDL 18 03/25/2024   LDLCALC 46 03/25/2024    Current Medications: Current Facility-Administered Medications  Medication Dose Route Frequency Provider Last Rate Last Admin   acetaminophen  (TYLENOL ) tablet 650 mg  650 mg Oral Q6H PRN Shrivastava, Aryendra, MD       alum & mag hydroxide-simeth (MAALOX/MYLANTA) 200-200-20 MG/5ML suspension 30 mL  30 mL Oral Q4H PRN Shrivastava, Aryendra, MD       buPROPion  (WELLBUTRIN  XL) 24 hr tablet 300 mg  300 mg Oral Daily Jadapalle, Sree,  MD   300 mg at 03/25/24 1157   haloperidol  (HALDOL ) tablet 5 mg  5 mg Oral TID PRN Shrivastava, Aryendra, MD       And   diphenhydrAMINE  (BENADRYL ) capsule 50 mg  50 mg Oral TID PRN Shrivastava, Aryendra, MD       haloperidol  lactate (HALDOL ) injection 5 mg  5 mg Intramuscular TID PRN Shrivastava, Aryendra, MD       And   diphenhydrAMINE  (BENADRYL ) injection 50 mg  50 mg Intramuscular TID PRN Shrivastava, Aryendra, MD       And   LORazepam  (ATIVAN ) injection 2 mg  2 mg Intramuscular TID PRN Shrivastava, Aryendra, MD       haloperidol  lactate (HALDOL ) injection 10 mg  10 mg Intramuscular TID PRN Shrivastava, Aryendra, MD       And   diphenhydrAMINE  (BENADRYL ) injection 50 mg  50 mg Intramuscular TID PRN Shrivastava, Aryendra, MD       And   LORazepam  (ATIVAN ) injection 2 mg  2 mg Intramuscular TID PRN Shrivastava, Aryendra, MD       FLUoxetine  (PROZAC ) capsule 20 mg  20 mg Oral Daily Jadapalle, Sree, MD   20 mg at 03/25/24 1203   lamoTRIgine  (LAMICTAL ) tablet 100 mg  100 mg Oral Daily Jadapalle, Sree, MD   100 mg at 03/25/24 1157   magnesium  hydroxide (MILK OF MAGNESIA) suspension 30 mL  30 mL Oral Daily PRN Shrivastava, Aryendra, MD       pantoprazole  (PROTONIX ) EC tablet 40 mg  40 mg Oral Daily Shrivastava, Aryendra, MD       PTA Medications: Medications Prior to Admission  Medication Sig Dispense Refill Last Dose/Taking   Adalimumab -adaz (HYRIMOZ ) 40 MG/0.4ML SOAJ Inject 1 Pen into the skin every 14 (fourteen) days. 0.8 mL 5    aspirin  EC 81 MG tablet Take 81 mg by mouth daily.      buPROPion  (WELLBUTRIN  XL) 150 MG 24 hr tablet Take 1 tablet by mouth daily.      esomeprazole (NEXIUM) 20 MG capsule Take 20 mg by mouth daily at 12 noon.      lamoTRIgine  (LAMICTAL ) 100 MG tablet       lamoTRIgine  (LAMICTAL ) 150 MG tablet Take 75 mg by mouth daily.      Roflumilast  (ZORYVE ) 0.3 % CREA Apply topically to affected areas of psoriasis as needed daily 60 g 3     Psychiatric Specialty  Exam:  Presentation  General Appearance:  Casual  Eye Contact: Good  Speech: Clear and Coherent  Speech Volume: Normal    Mood and Affect  Mood: Depressed  Affect: Congruent  Thought Process  Thought Processes: Coherent  Descriptions of Associations:Intact  Orientation:Full (Time, Place and Person)  Thought Content:Logical  Hallucinations:Hallucinations: None  Ideas of Reference:None  Suicidal Thoughts:Suicidal Thoughts: No  Homicidal Thoughts:Homicidal Thoughts: No   Sensorium  Memory: Recent Good; Remote Good; Immediate Good  Judgment: Good  Insight: Good   Executive Functions  Concentration:No data recorded Attention Span: Good  Recall: Good  Fund of Knowledge: Good  Language: Good   Psychomotor Activity  Psychomotor Activity: Psychomotor Activity: Normal   Assets  Assets: Communication Skills; Desire for Improvement; Financial Resources/Insurance; Housing; Social Support; Vocational/Educational    Musculoskeletal: Strength & Muscle Tone: within normal limits Gait & Station: normal  Physical Exam: Physical Exam Constitutional:      General: He is not in acute distress.    Appearance: Normal appearance. He is not ill-appearing.  HENT:     Head: Normocephalic and atraumatic.     Nose: Nose normal.  Pulmonary:     Effort: Pulmonary effort is normal.  Neurological:     Mental Status: He is alert and oriented to person, place, and time.  Psychiatric:        Attention and Perception: Attention normal. He does not perceive auditory or visual hallucinations.        Mood and Affect: Mood is depressed. Mood is not elated. Affect is not labile or angry.        Speech: Speech normal.        Behavior: Behavior normal. Behavior is cooperative.        Thought Content: Thought content normal. Thought content is not paranoid or delusional. Thought content does not include homicidal or suicidal ideation. Thought content does not  include homicidal or suicidal plan.        Cognition and Memory: Cognition normal.        Judgment: Judgment normal.    Review of Systems  Psychiatric/Behavioral:  Positive for depression. Negative for hallucinations, substance abuse and suicidal ideas. The patient does not have insomnia.   All other systems reviewed and are negative.  Blood pressure 128/89, pulse (!) 58, temperature 98.2 F (36.8 C), resp. rate 14, height 6' (1.829 m), weight 89.4 kg, SpO2 100%. Body mass index is 26.72 kg/m.  Principal Diagnosis: Bipolar II disorder (HCC) Diagnosis:  Principal Problem:   Bipolar II disorder (HCC) Active Problems:   Bipolar affect, depressed (HCC)   Clinical Decision Making:  Treatment Plan Summary:  Safety and Monitoring:             -- Voluntary admission to inpatient psychiatric unit for safety, stabilization and treatment             -- Daily contact with patient to assess and evaluate symptoms and progress in treatment             -- Patient's case to be discussed in multi-disciplinary team meeting             -- Observation Level: q15 minute checks             -- Vital signs:  q12 hours             -- Precautions: suicide, elopement, and assault   2. Psychiatric Diagnoses and Treatment: bipolar II disorder   Continue Wellbutrin  XL 300 mg once daily  Continue Prozac  20 mg once daily  Increase Lamictal  to 100 mg once daily  Discontinue Zyprexa  due to sedation              --  The risks/benefits/side-effects/alternatives to this medication were discussed in detail with the patient and time was given for questions. The patient consents to medication trial.                -- Metabolic profile and EKG monitoring obtained while on an atypical antipsychotic (BMI: Lipid Panel: HbgA1c: QTc:)              -- Encouraged patient to participate in unit milieu and in scheduled group therapies                            3. Medical Issues Being Addressed:      4. Discharge  Planning:              -- Social work and case management to assist with discharge planning and identification of hospital follow-up needs prior to discharge             -- Estimated LOS: 5-7 days             -- Discharge Concerns: Need to establish a safety plan; Medication compliance and effectiveness             -- Discharge Goals: Return home with outpatient referrals follow ups  Physician Treatment Plan for Primary Diagnosis: Bipolar II disorder (HCC) Long Term Goal(s): Improvement in symptoms so as ready for discharge  Short Term Goals: Ability to identify changes in lifestyle to reduce recurrence of condition will improve, Ability to verbalize feelings will improve, Ability to disclose and discuss suicidal ideas, and Ability to identify and develop effective coping behaviors will improve  Physician Treatment Plan for Secondary Diagnosis: Principal Problem:   Bipolar II disorder (HCC) Active Problems:   Bipolar affect, depressed (HCC)  Long Term Goal(s): Improvement in symptoms so as ready for discharge  Short Term Goals: Ability to identify changes in lifestyle to reduce recurrence of condition will improve, Ability to verbalize feelings will improve, Ability to disclose and discuss suicidal ideas, and Ability to identify and develop effective coping behaviors will improve  I certify that inpatient services furnished can reasonably be expected to improve the patient's condition.    Camelia LITTIE Lukes, PA-C 9/8/20251:00 PM

## 2024-03-25 NOTE — BHH Counselor (Signed)
 Adult Comprehensive Assessment  Patient ID: Bruce Kina., male   DOB: 08-14-1960, 63 y.o.   MRN: [id number]  Information Source: Information source: Patient  Current Stressors:  Patient states their primary concerns and needs for treatment are:: was feeling depressed Patient states their goals for this hospitilization and ongoing recovery are:: get my medication right Educational / Learning stressors: Pt denies. Employment / Job issues: Pt denies. Family Relationships: Pt denies. Financial / Lack of resources (include bankruptcy): Pt denies. Housing / Lack of housing: Pt denies. Physical health (include injuries & life threatening diseases): high chlolesterol, stint put in my heart, ablasion recently, need knee replacement Social relationships: Pt denies. Substance abuse: Pt denies. Bereavement / Loss: lost my dad who lived with us  about 8 months ago  Living/Environment/Situation:  Living Arrangements: Spouse/significant other Living conditions (as described by patient or guardian): WNL Who else lives in the home?: wife How long has patient lived in current situation?: 5 years What is atmosphere in current home: Comfortable, Paramedic, Supportive  Family History:  Marital status: Married Number of Years Married: 10 Does patient have children?: Yes How many children?: 4 How is patient's relationship with their children?: Pt reports 2 biological children and 2 step children.  Describes relationship as good  Childhood History:  By whom was/is the patient raised?: Both parents Description of patient's relationship with caregiver when they were a child: good relationship with my dad, not good with my mom Patient's description of current relationship with people who raised him/her: Pt reports that parents are deceased. How were you disciplined when you got in trouble as a child/adolescent?: my mom was the disciplinarian Does patient have siblings?: Yes Number of  Siblings: 1 Description of patient's current relationship with siblings: nonexistent Did patient suffer any verbal/emotional/physical/sexual abuse as a child?: Yes Did patient suffer from severe childhood neglect?: No Has patient ever been sexually abused/assaulted/raped as an adolescent or adult?: No Was the patient ever a victim of a crime or a disaster?: No Witnessed domestic violence?: No Has patient been affected by domestic violence as an adult?: No  Education:  Highest grade of school patient has completed: college Currently a student?: No Learning disability?: No  Employment/Work Situation:   Employment Situation: Employed Where is Patient Currently Employed?: 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?: No  Financial Resources:   Financial resources: Income from employment, Medicare, Receives SSI, Income from spouse Does patient have a Lawyer or guardian?: No  Alcohol/Substance Abuse:   What has been your use of drugs/alcohol within the last 12 months?: Pt denies. If attempted suicide, did drugs/alcohol play a role in this?: No Alcohol/Substance Abuse Treatment Hx: Denies past history Has alcohol/substance abuse ever caused legal problems?: No  Social Support System:   Patient's Community Support System: Good Describe Community Support System: my wife and some friends Type of faith/religion: Pt denies. How does patient's faith help to cope with current illness?: Pt denies.  Leisure/Recreation:   Do You Have Hobbies?: Yes Leisure and Hobbies: grow flowers, exercise, like to  work  Strengths/Needs:   What is the patient's perception of their  strengths?: work hard, I think I am giving Patient states they can use these personal strengths during their treatment to contribute to their recovery: Pt denies. Patient states these barriers may affect/interfere with their treatment: Pt denies. Patient states these barriers may affect their return to the community: Pt denies. Other important information patient would like considered in planning for their treatment: Pt denies.  Discharge Plan:   Currently receiving community mental health services: No Patient states concerns and preferences for aftercare planning are: Pt reports that he would like a provider when discharged. Patient states they will know when they are safe and ready for discharge when: hopefully when I feel normal again Does patient have access to transportation?: Yes Does patient have financial barriers related to discharge medications?: No Will patient be returning to same living situation after discharge?: Yes  Summary/Recommendations:   Summary and Recommendations (to be completed by the evaluator): Patient is a 63 year old male from [place], [place] [facility] [place]).  Patient presents to the hospital for increasing depression that he describes at going into a black hole.  Patient reports that he has Bipolar Disorder.  He reports that the last that he experienced depression like this was twenty years ago.  He reports that he was experiencing thoughts of wanting to harm himself.  He reports that he has a history of attempts.  He reports that last attempt was over 20 years ago, and he attempted to hang himself.  He reports a belief that his current mental health symptoms has been triggered by having two recent medical procedures that required him to be placed under anesthesia; as well as starting a new medication.  He also reports that he recently lost his father.  Recommendations include crisis stabilization,  therapeutic milieu, encourage group attendance and participation, medication management for detox/mood stabilization and development of comprehensive mental wellness/sobriety plan.  Bruce JINNY Margo. 03/25/2024

## 2024-03-25 NOTE — Progress Notes (Signed)
 Pt is AAOx4. He denies SI/HI/AVH. He endorsed some anxiety early in the shift, but used deep breathing to help him relax which was effective. He denies physical symptoms. He is safe on the unit at this time with Q 15 min safety checks in place.

## 2024-03-25 NOTE — Plan of Care (Signed)
   Problem: Education: Goal: Emotional status will improve Outcome: Progressing Goal: Mental status will improve Outcome: Progressing   Problem: Activity: Goal: Interest or engagement in activities will improve Outcome: Progressing Goal: Sleeping patterns will improve Outcome: Progressing

## 2024-03-25 NOTE — Group Note (Signed)
 Date:  03/25/2024 Time:  11:56 PM  Group Topic/Focus:  Wrap-Up Group:   The focus of this group is to help patients review their daily goal of treatment and discuss progress on daily workbooks.    Participation Level:  Active  Participation Quality:  Appropriate  Affect:  Appropriate  Cognitive:  Appropriate  Insight: Appropriate  Engagement in Group:  Engaged  Modes of Intervention:  Discussion  Additional Comments:    Tommas CHRISTELLA Bunker 03/25/2024, 11:56 PM

## 2024-03-25 NOTE — Plan of Care (Signed)
   Problem: Education: Goal: Emotional status will improve Outcome: Progressing Goal: Mental status will improve Outcome: Progressing Goal: Verbalization of understanding the information provided will improve Outcome: Progressing   Problem: Activity: Goal: Interest or engagement in activities will improve Outcome: Progressing

## 2024-03-25 NOTE — BHH Suicide Risk Assessment (Signed)
 [facility] Admission Suicide Risk Assessment   Nursing information obtained from:    Demographic factors:  Age 65 or older, Male, Caucasian Current Mental Status:  NA Loss Factors:  NA Historical Factors:  NA Risk Reduction Factors:  NA  Total Time spent with patient: 30 minutes Principal Problem: Bipolar II disorder (HCC) Diagnosis:  Principal Problem:   Bipolar II disorder (HCC) Active Problems:   Bipolar affect, depressed (HCC)  Subjective Data: This is a 63 year old male with a history of bipolar disorder. Presenting issues included worsening depression over the last 4-6 weeks. Patient initially reported SI with plan while in ED but today on interview he denies SI/plan. He denies HI/plan and denies hallucinations. Symptoms include depressed mood, anhedonia, low energy, and low motivation. He endorses history of hypomania but denies history of true manic episode. Family history is significant for bipolar disorder in his mother. Current stressors include the recent passing of his father, and recent health issues including heart stent placement and joint pain. Patient is admitted to the Geropsych unit with every 15 minute safety monitoring. Multidisciplinary team approach is offered. Medication management, group/milieu therapy is offered.   Continued Clinical Symptoms:  Alcohol Use Disorder Identification Test Final Score (AUDIT): 4 The Alcohol Use Disorders Identification Test, Guidelines for Use in Primary Care, Second Edition.  World Science writer [facility]). Score between 0-7:  no or low risk or alcohol related problems. Score between 8-15:  moderate risk of alcohol related problems. Score between 16-19:  high risk of alcohol related problems. Score 20 or above:  warrants further diagnostic evaluation for alcohol dependence and treatment.   CLINICAL FACTORS:   Bipolar Disorder:   Bipolar II Depressive phase   Musculoskeletal: Strength & Muscle Tone: within normal limits Gait &  Station: normal Patient leans: N/A  Psychiatric Specialty Exam:  Presentation  General Appearance:  Casual  Eye Contact: Good  Speech: Clear and Coherent  Speech Volume: Normal  Handedness:No data recorded  Mood and Affect  Mood: Depressed  Affect: Congruent   Thought Process  Thought Processes: Coherent  Descriptions of Associations:Intact  Orientation:Full (Time, Place and Person)  Thought Content:Logical  History of Schizophrenia/Schizoaffective disorder:No  Duration of Psychotic Symptoms:No data recorded Hallucinations:Hallucinations: None  Ideas of Reference:None  Suicidal Thoughts:Suicidal Thoughts: No  Homicidal Thoughts:Homicidal Thoughts: No   Sensorium  Memory: Recent Good; Remote Good; Immediate Good  Judgment: Good  Insight: Good   Executive Functions  Concentration:No data recorded Attention Span: Good  Recall: Good  Fund of Knowledge: Good  Language: Good   Psychomotor Activity  Psychomotor Activity: Psychomotor Activity: Normal   Assets  Assets: Communication Skills; Desire for Improvement; Financial Resources/Insurance; Housing; Social Support; Vocational/Educational   Sleep  Sleep: Sleep: Good Number of Hours of Sleep: 8    Physical Exam: Physical Exam Constitutional:      Appearance: Normal appearance.  HENT:     Head: Normocephalic and atraumatic.     Nose: Nose normal.  Pulmonary:     Effort: Pulmonary effort is normal.  Neurological:     Mental Status: He is alert.  Psychiatric:        Attention and Perception: Attention normal.        Mood and Affect: Mood is depressed. Mood is not anxious or elated. Affect is not labile or angry.        Speech: Speech normal.        Behavior: Behavior normal. Behavior is not agitated, aggressive, hyperactive or combative. Behavior is cooperative.  Thought Content: Thought content normal. Thought content is not paranoid or delusional. Thought  content does not include homicidal or suicidal ideation. Thought content does not include homicidal or suicidal plan.        Cognition and Memory: Cognition and memory normal.        Judgment: Judgment normal.    Review of Systems  Psychiatric/Behavioral:  Positive for depression. Negative for hallucinations, substance abuse and suicidal ideas. The patient is not nervous/anxious and does not have insomnia.   All other systems reviewed and are negative.  Blood pressure 128/89, pulse (!) 58, temperature 98.2 F (36.8 C), resp. rate 14, height 6' (1.829 m), weight 89.4 kg, SpO2 100%. Body mass index is 26.72 kg/m.   COGNITIVE FEATURES THAT CONTRIBUTE TO RISK:  None    SUICIDE RISK:   Mild:  Suicidal ideation of limited frequency, intensity, duration, and specificity.  There are no identifiable plans, no associated intent, mild dysphoria and related symptoms, good self-control (both objective and subjective assessment), few other risk factors, and identifiable protective factors, including available and accessible social support.  PLAN OF CARE: Patient is admitted to the Geropsych unit with every 15 minute safety monitoring. Multidisciplinary team approach is offered. Medication management, group/milieu therapy is offered.   I certify that inpatient services furnished can reasonably be expected to improve the patient's condition.   Shayde Gervacio LITTIE Lukes, PA-C 03/25/2024, 12:50 PM

## 2024-03-26 LAB — HIV ANTIBODY (ROUTINE TESTING W REFLEX): HIV Screen 4th Generation wRfx: NONREACTIVE

## 2024-03-26 MED ORDER — CLONIDINE HCL 0.1 MG PO TABS
0.1000 mg | ORAL_TABLET | Freq: Once | ORAL | Status: AC
  Administered 2024-03-26: 0.1 mg via ORAL
  Filled 2024-03-26: qty 1

## 2024-03-26 NOTE — Progress Notes (Signed)
 Pt is AAOx4. He denies SI/HI/AVH. He does not endorse anxiety or depression at this time. He denies physical complaints. Pt stated that he takes Plavix  75 mg PO at home daily. NP Jon notified. He had a manual BP of 150/92 at 2040. NP notified and Clonidine  0.1 mg ordered and given. After 45 minutes BP was rechecked manually and was 151/93. NP notified and no new orders at this time. He is safe on the unit at this time with Q 15 min safety checks in place.

## 2024-03-26 NOTE — Group Note (Signed)
 Date:  03/26/2024 Time:  11:09 AM  Group Topic/Focus:  Self Care:   The focus of this group is to help patients understand the importance of self-care in order to improve or restore emotional, physical, spiritual, interpersonal, and financial health.    Participation Level:  Active  Participation Quality:  Appropriate  Affect:  Appropriate  Cognitive:  Appropriate  Insight: Good  Engagement in Group:  Engaged  Modes of Intervention:  Activity  Additional Comments:  N/A  Bruce Chambers Gavel 03/26/2024, 11:09 AM

## 2024-03-26 NOTE — Progress Notes (Signed)
 Pt is seen in bed with eyes closed. Chest rise and fall observed. He is safe on the unit at this time with Q 15 min safety checks in place.

## 2024-03-26 NOTE — Group Note (Signed)
 Recreation Therapy Group Note   Group Topic:Coping Skills  Group Date: 03/26/2024 Start Time: 1400 End Time: 1500 Facilitators: Celestia Jeoffrey BRAVO, LRT, CTRS Location: Courtyard  Group Description: Mind Map.  Patient was provided a blank template of a diagram with 32 blank boxes in a tiered system, branching from the center (similar to a bubble chart). LRT directed patients to label the middle of the diagram Coping Skills. LRT and patients then came up with 8 different coping skills as examples. Pt were directed to record their coping skills in the 2nd tier boxes closest to the center.  Patients would then share their coping skills with the group as LRT wrote them out. LRT gave a handout of 99 different coping skills at the end of group.   Goal Area(s) Addressed: Patients will be able to define "coping skills". Patient will identify new coping skills.  Patient will increase communication.  Affect/Mood: Appropriate   Participation Level: Active and Engaged   Participation Quality: Independent   Behavior: Calm and Cooperative   Speech/Thought Process: Coherent   Insight: Fair   Judgement: Fair    Modes of Intervention: Education, Exploration, and Worksheet   Patient Response to Interventions:  Attentive and Receptive   Education Outcome:  Acknowledges education   Clinical Observations/Individualized Feedback: Clint was active in their participation of session activities and group discussion. Pt identified coping skills are things that calm you. Pt interacted well with LRT and peers duration of session.   Plan: Continue to engage patient in RT group sessions 2-3x/week.   Jeoffrey BRAVO Celestia, LRT, CTRS 03/26/2024 5:40 PM

## 2024-03-26 NOTE — Progress Notes (Signed)
 [facility] MD Progress Note  03/26/2024 1:48 PM Bruce W Kitch Jr.  MRN:  [id number]  Bruce Barletta. is a 63 y.o. male admitted: Presented to the ED Bruce Chambers. is a 63 y.o. male with a history of hypertension, bipolar disorder who comes ED complaining of feeling like his chemicals are out of balance.  Reports SI, no self-injurious behaviors thus far.  Denies any pain or fever. Psychiatry was consulted for safety evaluation. Patient is admitted to the [facility] unit for further stabilization.   Subjective:  Chart reviewed, case discussed in multidisciplinary meeting, patient seen during rounds.  On interview, patient is noted to be seated comfortably on the bed in his room. He reports feeling much better today and denies current depression or anxiety. He denies SI/HI/plan and denies hallucinations. He reports sleeping and eating well. He has been compliant with his medications and denies side effects.  Per nursing report, patient endorsed some anxiety yesterday, but used deep breathing to help him relax, which was effective. He denied physical symptoms. He is active in group therapy. Patient reports his wife and son, who live nearby, came to visit him yesterday.  Sleep: Good  Appetite:  Good  Past Psychiatric History: see h&P Family History:  Family History  Problem Relation Age of Onset   Emphysema Mother    Atrial fibrillation Father    Atrial fibrillation Sister    Social History:  Social History   Substance and Sexual Activity  Alcohol Use Yes   Alcohol/week: 2.0 standard drinks of alcohol   Types: 2 Glasses of wine per week     Social History   Substance and Sexual Activity  Drug Use Never    Social History   Socioeconomic History   Marital status: Single    Spouse name: Not on file   Number of children: Not on file   Years of education: Not on file   Highest education level: Not on file  Occupational History   Not on file  Tobacco Use   Smoking status: Never    Smokeless tobacco: Never  Vaping Use   Vaping status: Never Used  Substance and Sexual Activity   Alcohol use: Yes    Alcohol/week: 2.0 standard drinks of alcohol    Types: 2 Glasses of wine per week   Drug use: Never   Sexual activity: Not on file  Other Topics Concern   Not on file  Social History Narrative   Not on file   Social Drivers of Health   Financial Resource Strain: Patient Declined (02/20/2024)   Received from [facility]   Overall Financial Resource Strain (CARDIA)    Difficulty of Paying Living Expenses: Patient declined  Food Insecurity: No Food Insecurity (03/24/2024)   Hunger Vital Sign    Worried About Running Out of Food in the Last Year: Never true    Ran Out of Food in the Last Year: Never true  Transportation Needs: No Transportation Needs (03/24/2024)   PRAPARE - Administrator, Civil Service (Medical): No    Lack of Transportation (Non-Medical): No  Physical Activity: Not on file  Stress: Not on file  Social Connections: Unknown (03/24/2024)   Social Connection and Isolation Panel    Frequency of Communication with Friends and Family: Not on file    Frequency of Social Gatherings with Friends and Family: Not on file    Attends Religious Services: Not on file    Active Member of Clubs  or Organizations: Not on file    Attends Club or Organization Meetings: Not on file    Marital Status: Married   Past Medical History:  Past Medical History:  Diagnosis Date   Bipolar 1 disorder (HCC)    Dysplastic nevus 11/29/2023   Right infrascapular, moderate atypia   GERD (gastroesophageal reflux disease)    Hypertension    Psoriasis    PVC (premature ventricular contraction)    History reviewed. No pertinent surgical history.  Current Medications: Current Facility-Administered Medications  Medication Dose Route Frequency Provider Last Rate Last Admin   acetaminophen  (TYLENOL ) tablet 650 mg  650 mg Oral Q6H PRN Shrivastava,  Aryendra, MD       alum & mag hydroxide-simeth (MAALOX/MYLANTA) 200-200-20 MG/5ML suspension 30 mL  30 mL Oral Q4H PRN Shrivastava, Aryendra, MD       buPROPion  (WELLBUTRIN  XL) 24 hr tablet 300 mg  300 mg Oral Daily Jadapalle, Sree, MD   300 mg at 03/26/24 9065   haloperidol  (HALDOL ) tablet 5 mg  5 mg Oral TID PRN Shrivastava, Aryendra, MD       And   diphenhydrAMINE  (BENADRYL ) capsule 50 mg  50 mg Oral TID PRN Shrivastava, Aryendra, MD       haloperidol  lactate (HALDOL ) injection 5 mg  5 mg Intramuscular TID PRN Shrivastava, Aryendra, MD       And   diphenhydrAMINE  (BENADRYL ) injection 50 mg  50 mg Intramuscular TID PRN Shrivastava, Aryendra, MD       And   LORazepam  (ATIVAN ) injection 2 mg  2 mg Intramuscular TID PRN Shrivastava, Aryendra, MD       haloperidol  lactate (HALDOL ) injection 10 mg  10 mg Intramuscular TID PRN Shrivastava, Aryendra, MD       And   diphenhydrAMINE  (BENADRYL ) injection 50 mg  50 mg Intramuscular TID PRN Shrivastava, Aryendra, MD       And   LORazepam  (ATIVAN ) injection 2 mg  2 mg Intramuscular TID PRN Shrivastava, Aryendra, MD       FLUoxetine  (PROZAC ) capsule 20 mg  20 mg Oral Daily Jadapalle, Sree, MD   20 mg at 03/26/24 9065   lamoTRIgine  (LAMICTAL ) tablet 100 mg  100 mg Oral Daily Jadapalle, Sree, MD   100 mg at 03/26/24 0935   magnesium  hydroxide (MILK OF MAGNESIA) suspension 30 mL  30 mL Oral Daily PRN Shrivastava, Aryendra, MD       pantoprazole  (PROTONIX ) EC tablet 40 mg  40 mg Oral Daily Shrivastava, Aryendra, MD   40 mg at 03/26/24 0935    Lab Results:  Results for orders placed or performed during the hospital encounter of 03/24/24 (from the past 48 hours)  Hemoglobin A1c     Status: None   Collection Time: 03/25/24  6:22 AM  Result Value Ref Range   Hgb A1c MFr Bld 5.2 4.8 - 5.6 %    Comment: (NOTE) Diagnosis of Diabetes The following HbA1c ranges recommended by the American Diabetes Association (ADA) may be used as an aid in the diagnosis of  diabetes mellitus.  Hemoglobin             Suggested A1C NGSP%              Diagnosis  <5.7                   Non Diabetic  5.7-6.4                Pre-Diabetic  >6.4  Diabetic  <7.0                   Glycemic control for                       adults with diabetes.     Mean Plasma Glucose 102.54 mg/dL    Comment: Performed at [facility], [address], [place], [place] [address]  CBC     Status: None   Collection Time: 03/25/24  6:22 AM  Result Value Ref Range   WBC 7.8 4.0 - 10.5 K/uL   RBC 4.91 4.22 - 5.81 MIL/uL   Hemoglobin 15.5 13.0 - 17.0 g/dL   HCT 55.1 60.9 - 47.9 %   MCV 91.2 80.0 - 100.0 fL   MCH 31.6 26.0 - 34.0 pg   MCHC 34.6 30.0 - 36.0 g/dL   RDW 86.8 88.4 - 84.4 %   Platelets 223 150 - 400 K/uL   nRBC 0.0 0.0 - 0.2 %    Comment: Performed at [facility], [address] [place], [place] [address]  Lipid panel     Status: None   Collection Time: 03/25/24  6:22 AM  Result Value Ref Range   Cholesterol 107 0 - 200 mg/dL   Triglycerides 90 <849 mg/dL   HDL 43 >59 mg/dL   Total CHOL/HDL Ratio 2.5 RATIO   VLDL 18 0 - 40 mg/dL   LDL Cholesterol 46 0 - 99 mg/dL    Comment:        Total Cholesterol/HDL:CHD Risk Coronary Heart Disease Risk Table                     Men   Women  1/2 Average Risk   3.4   3.3  Average Risk       5.0   4.4  2 X Average Risk   9.6   7.1  3 X Average Risk  23.4   11.0        Use the calculated Patient Ratio above and the CHD Risk Table to determine the patient's CHD Risk.        ATP III CLASSIFICATION (LDL):  <100     mg/dL   Optimal  899-870  mg/dL   Near or Above                    Optimal  130-159  mg/dL   Borderline  839-810  mg/dL   High  >809     mg/dL   Very High Performed at [facility], [address] [place], [place] [address]   TSH     Status: None   Collection Time: 03/25/24  6:22 AM  Result Value Ref Range   TSH 1.192 0.350 - 4.500 uIU/mL    Comment:  Performed by a 3rd Generation assay with a functional sensitivity of <=0.01 uIU/mL. Performed at [facility], [address] [place], [place] [address]   Hepatitis panel, acute     Status: None   Collection Time: 03/25/24  6:22 AM  Result Value Ref Range   Hepatitis B Surface Ag NON REACTIVE NON REACTIVE   HCV Ab NON REACTIVE NON REACTIVE    Comment: (NOTE) Nonreactive HCV antibody screen is consistent with no HCV infections,  unless recent infection is suspected or other evidence exists to indicate HCV infection.     Hep A IgM NON REACTIVE NON REACTIVE  Hep B C IgM NON REACTIVE NON REACTIVE    Comment: Performed at [facility], [address] [place], [place] [address]  RPR     Status: None   Collection Time: 03/25/24  6:22 AM  Result Value Ref Range   RPR Ser Ql NON REACTIVE NON REACTIVE    Comment: Performed at [facility], [address], [place], [place] [address]    Blood Alcohol level:  Lab Results  Component Value Date   [facility] <15 03/24/2024    Metabolic Disorder Labs: Lab Results  Component Value Date   HGBA1C 5.2 03/25/2024   MPG 102.54 03/25/2024   No results found for: PROLACTIN Lab Results  Component Value Date   CHOL 107 03/25/2024   TRIG 90 03/25/2024   HDL 43 03/25/2024   CHOLHDL 2.5 03/25/2024   VLDL 18 03/25/2024   LDLCALC 46 03/25/2024    Physical Findings: AIMS:  , ,  ,  ,    CIWA:    COWS:      Psychiatric Specialty Exam:  Presentation  General Appearance:  Casual  Eye Contact: Good  Speech: Clear and Coherent  Speech Volume: Normal    Mood and Affect  Mood: Euthymic   Affect: Congruent   Thought Process  Thought Processes: Coherent  Descriptions of Associations:Intact  Orientation:Full (Time, Place and Person)  Thought Content:Logical  Hallucinations:Hallucinations: None  Ideas of Reference:None  Suicidal Thoughts:Suicidal Thoughts: No  Homicidal Thoughts:Homicidal Thoughts:  No   Sensorium  Memory: Recent Good; Remote Good; Immediate Good  Judgment: Good  Insight: Good   Executive Functions  Concentration:No data recorded Attention Span: Good  Recall: Good  Fund of Knowledge: Good  Language: Good   Psychomotor Activity  Psychomotor Activity: Psychomotor Activity: Normal  Musculoskeletal: Strength & Muscle Tone: within normal limits Gait & Station: normal Assets  Assets: Manufacturing systems engineer; Desire for Improvement; Financial Resources/Insurance; Housing; Social Support; Vocational/Educational    Physical Exam: Physical Exam Constitutional:      Appearance: Normal appearance.  HENT:     Head: Normocephalic and atraumatic.     Nose: Nose normal.     Mouth/Throat:     Mouth: Mucous membranes are moist.  Pulmonary:     Effort: Pulmonary effort is normal.  Neurological:     Mental Status: He is alert.  Psychiatric:        Attention and Perception: Attention normal. He does not perceive auditory or visual hallucinations.        Mood and Affect: Mood and affect normal. Mood is not anxious, depressed or elated. Affect is not labile or angry.        Speech: Speech normal.        Behavior: Behavior is cooperative.        Thought Content: Thought content normal. Thought content is not paranoid or delusional. Thought content does not include homicidal or suicidal ideation. Thought content does not include homicidal or suicidal plan.        Cognition and Memory: Cognition normal.        Judgment: Judgment normal.    Review of Systems  Psychiatric/Behavioral:  Negative for depression, hallucinations and suicidal ideas. The patient is not nervous/anxious and does not have insomnia.   All other systems reviewed and are negative.  Blood pressure (!) 160/97, pulse (!) 59, temperature 98.2 F (36.8 C), resp. rate 18, height 6' (1.829 m), weight 89.4 kg, SpO2 100%. Body mass index is 26.72 kg/m.  Diagnosis: Principal Problem:  Bipolar II disorder (HCC) Active Problems:   Bipolar affect, depressed (HCC)   PLAN: Safety and Monitoring:  -- Voluntary admission to inpatient psychiatric unit for safety, stabilization and treatment  -- Daily contact with patient to assess and evaluate symptoms and progress in treatment  -- Patient's case to be discussed in multi-disciplinary team meeting  -- Observation Level : q15 minute checks  -- Vital signs:  q12 hours  -- Precautions: suicide, elopement, and assault -- Encouraged patient to participate in unit milieu and in scheduled group therapies  2. Psychiatric Diagnoses and Treatment: Bipolar II disorder, current depressed state   Continue Wellbutrin  XL 300 mg once daily  Continue Prozac  20 mg once daily  Continue Lamictal  100 mg once daily  Discontinued Zyprexa  due to sedation     3. Medical Issues Being Addressed:   Continue Protonix  40 mg    4. Discharge Planning:   -- Social work and case management to assist with discharge planning and identification of hospital follow-up needs prior to discharge  -- Estimated LOS: 3-4 days  [employer], PA-C 03/26/2024, 1:48 PM

## 2024-03-26 NOTE — Progress Notes (Signed)
 Pt is seen in bed with eyes closed. Chest rise and fall observed. He is safe on the unit at this time with Q 15 min safety checks in place. He slept for about 8 hours through the night with no complaints.

## 2024-03-26 NOTE — Group Note (Signed)
 LCSW Group Therapy Note  Group Date: 03/26/2024 Start Time: 1300 End Time: 1330   Type of Therapy and Topic:  Group Therapy: Positive Affirmations  Participation Level:  Active   Description of Group:   This group addressed positive affirmation towards self and others.  Patients went around the room and identified two positive things about themselves and two positive things about a peer in the room.  Patients reflected on how it felt to share something positive with others, to identify positive things about themselves, and to hear positive things from others/ Patients were encouraged to have a daily reflection of positive characteristics or circumstances.   Therapeutic Goals: Patients will verbalize two of their positive qualities Patients will demonstrate empathy for others by stating two positive qualities about a peer in the group Patients will verbalize their feelings when voicing positive self affirmations and when voicing positive affirmations of others Patients will discuss the potential positive impact on their wellness/recovery of focusing on positive traits of self and others.  Summary of Patient Progress:  Bruce Chambers actively engaged in the discussion and . He  was able to identify positive affirmations about himself as well as other group members. Patient demonstrated good insight into the subject matter, was respectful of peers, participated throughout the entire session.  Therapeutic Modalities:   Cognitive Behavioral Therapy Motivational Interviewing    Bruce Chambers, [place] 03/26/2024  1:48 PM

## 2024-03-26 NOTE — Group Note (Signed)
 Date:  03/26/2024 Time:  11:50 PM  Group Topic/Focus:  Wrap-Up Group:   The focus of this group is to help patients review their daily goal of treatment and discuss progress on daily workbooks.    Participation Level:  Active  Participation Quality:  Appropriate  Affect:  Appropriate  Cognitive:  Alert  Insight: Appropriate  Engagement in Group:  Engaged  Modes of Intervention:  Discussion  Additional Comments:    Bruce Chambers Bunker 03/26/2024, 11:50 PM

## 2024-03-26 NOTE — Progress Notes (Signed)
   03/26/24 0726  Psych Admission Type (Psych Patients Only)  Admission Status Voluntary  Psychosocial Assessment  Patient Complaints Anxiety  Eye Contact Fair  Facial Expression Animated  Affect Appropriate to circumstance  Speech Logical/coherent  Interaction Assertive  Motor Activity Slow  Appearance/Hygiene Unremarkable  Behavior Characteristics Cooperative  Mood Pleasant  Thought Process  Coherency WDL  Content WDL  Delusions None reported or observed  Perception WDL  Hallucination None reported or observed  Judgment WDL  Confusion None  Danger to Self  Current suicidal ideation? Denies  Danger to Others  Danger to Others None reported or observed

## 2024-03-27 DIAGNOSIS — F3181 Bipolar II disorder: Principal | ICD-10-CM

## 2024-03-27 MED ORDER — CLOPIDOGREL BISULFATE 75 MG PO TABS: 75.0000 mg | ORAL_TABLET | Freq: Every day | ORAL | Status: DC

## 2024-03-27 MED ORDER — CLONIDINE HCL 0.1 MG PO TABS
0.1000 mg | ORAL_TABLET | Freq: Once | ORAL | Status: AC
  Administered 2024-03-27: 0.1 mg via ORAL
  Filled 2024-03-27: qty 1

## 2024-03-27 MED ORDER — CLOPIDOGREL BISULFATE 75 MG PO TABS
75.0000 mg | ORAL_TABLET | Freq: Every day | ORAL | Status: DC
  Administered 2024-03-27: 75 mg via ORAL
  Filled 2024-03-27: qty 1

## 2024-03-27 NOTE — Group Note (Signed)
 Date:  03/27/2024 Time:  11:00 AM  Group Topic/Focus:  Fresh air therapy with music and cards    Participation Level:  Active  Participation Quality:  Appropriate  Affect:  Appropriate  Cognitive:  Appropriate  Insight: Appropriate  Engagement in Group:  Engaged  Modes of Intervention:  Activity and Socialization  Additional Comments:  none  Norleen SHAUNNA Bias 03/27/2024, 11:00 AM

## 2024-03-27 NOTE — BHH Suicide Risk Assessment (Signed)
 [facility] INPATIENT:  Family/Significant Other Suicide Prevention Education  Suicide Prevention Education:  Education Completed; Ziyad Dyar, [phone] ,  (name of family member/significant other) has been identified by the patient as the family member/significant other with whom the patient will be residing, and identified as the person(s) who will aid the patient in the event of a mental health crisis (suicidal ideations/suicide attempt).  With written consent from the patient, the family member/significant other has been provided the following suicide prevention education, prior to the and/or following the discharge of the patient.  The suicide prevention education provided includes the following: Suicide risk factors Suicide prevention and interventions National Suicide Hotline telephone number [facility] assessment telephone number [facility] Emergency Assistance 911 [facility] and/or Residential Mobile Crisis Unit telephone number  Request made of family/significant other to: Remove weapons (e.g., guns, rifles, knives), all items previously/currently identified as safety concern.   Remove drugs/medications (over-the-counter, prescriptions, illicit drugs), all items previously/currently identified as a safety concern.  The family member/significant other verbalizes understanding of the suicide prevention education information provided.  The family member/significant other agrees to remove the items of safety concern listed above.  I never felt like he was a harm to anybody else, but I did feel like he might hurt himself, but he seems to be doing better now   Reports she has visited pt twice in the hospital.   Reports that she feels he is doing better.  Reports that there is a gun but that she locked gun in a gun safe.   [place] reports no concerns regarding pt's discharge.   CSW encouraged pt's wife to call back should she have any concerns.   Lum JONETTA Croft 03/27/2024, 9:15 AM

## 2024-03-27 NOTE — Progress Notes (Signed)
 Pt seen in bed with eyes closed and rise and fall of chest. He slept for about 6 hours. He is safe on the unit at this time with Q 15 min safety checks in place.

## 2024-03-27 NOTE — Group Note (Unsigned)
 Date:  03/27/2024 Time:  10:52 AM  Group Topic/Focus:  Fresh air therapy with music and cards     Participation Level:  {BHH PARTICIPATION OZCZO:77735}  Participation Quality:  {B[facility] PARTICIPATION QUALITY:22265}  Affect:  {BHH AFFECT:22266}  Cognitive:  {B[facility] COGNITIVE:22267}  Insight: {BHH Insight2:20797}  Engagement in Group:  {BHH ENGAGEMENT IN HMNLE:77731}  Modes of Intervention:  {BHH MODES OF INTERVENTION:22269}  Additional Comments:  ***  Norleen SHAUNNA Bias 03/27/2024, 10:52 AM

## 2024-03-27 NOTE — Plan of Care (Signed)

## 2024-03-27 NOTE — Group Note (Signed)
 Date:  03/27/2024 Time:  8:56 PM  Group Topic/Focus:  Coping With Mental Health Crisis:   The purpose of this group is to help patients identify strategies for coping with mental health crisis.  Group discusses possible causes of crisis and ways to manage them effectively. We also discussed rules and expectations for the unit and did a word search of their choice.    Participation Level:  Active  Participation Quality:  Appropriate  Affect:  Appropriate  Cognitive:  Appropriate  Insight: Appropriate  Engagement in Group:  Engaged  Modes of Intervention:  Discussion  Additional Comments:    [name] 03/27/2024, 8:56 PM

## 2024-03-27 NOTE — Progress Notes (Signed)
 [facility] MD Progress Note  03/27/2024 4:33 PM Don W Searight Jr.  MRN:  [id number]  Verlie Hellenbrand. is a 63 y.o. male admitted: Presented to the ED Ygnacio W Jayin Derousse. is a 63 y.o. male with a history of hypertension, bipolar disorder who comes ED complaining of feeling like his chemicals are out of balance.  Reports SI, no self-injurious behaviors thus far.  Denies any pain or fever. Psychiatry was consulted for safety evaluation. Patient is admitted to the [facility] unit for further stabilization.   Subjective:  Chart reviewed, case discussed in multidisciplinary meeting, patient seen during rounds.  Patient is noted to be sitting in his bed and reading a book.  He reports he is ready to go home.  He reports his depression and anxiety are under control.  He denies SI/HI/plan.  He reports fair appetite and sleep.  He wants to have outpatient mental health resources after discharge   Sleep: Good  Appetite:  Good  Past Psychiatric History: see h&P Family History:  Family History  Problem Relation Age of Onset   Emphysema Mother    Atrial fibrillation Father    Atrial fibrillation Sister    Social History:  Social History   Substance and Sexual Activity  Alcohol Use Yes   Alcohol/week: 2.0 standard drinks of alcohol   Types: 2 Glasses of wine per week     Social History   Substance and Sexual Activity  Drug Use Never    Social History   Socioeconomic History   Marital status: Single    Spouse name: Not on file   Number of children: Not on file   Years of education: Not on file   Highest education level: Not on file  Occupational History   Not on file  Tobacco Use   Smoking status: Never   Smokeless tobacco: Never  Vaping Use   Vaping status: Never Used  Substance and Sexual Activity   Alcohol use: Yes    Alcohol/week: 2.0 standard drinks of alcohol    Types: 2 Glasses of wine per week   Drug use: Never   Sexual activity: Not on file  Other Topics Concern   Not on  file  Social History Narrative   Not on file   Social Drivers of Health   Financial Resource Strain: Patient Declined (02/20/2024)   Received from [facility]   Overall Financial Resource Strain (CARDIA)    Difficulty of Paying Living Expenses: Patient declined  Food Insecurity: No Food Insecurity (03/24/2024)   Hunger Vital Sign    Worried About Running Out of Food in the Last Year: Never true    Ran Out of Food in the Last Year: Never true  Transportation Needs: No Transportation Needs (03/24/2024)   PRAPARE - Administrator, Civil Service (Medical): No    Lack of Transportation (Non-Medical): No  Physical Activity: Not on file  Stress: Not on file  Social Connections: Unknown (03/24/2024)   Social Connection and Isolation Panel    Frequency of Communication with Friends and Family: Not on file    Frequency of Social Gatherings with Friends and Family: Not on file    Attends Religious Services: Not on file    Active Member of Clubs or Organizations: Not on file    Attends Banker Meetings: Not on file    Marital Status: Married   Past Medical History:  Past Medical History:  Diagnosis Date   Bipolar 1 disorder (  HCC)    Dysplastic nevus 11/29/2023   Right infrascapular, moderate atypia   GERD (gastroesophageal reflux disease)    Hypertension    Psoriasis    PVC (premature ventricular contraction)    History reviewed. No pertinent surgical history.  Current Medications: Current Facility-Administered Medications  Medication Dose Route Frequency Provider Last Rate Last Admin   acetaminophen  (TYLENOL ) tablet 650 mg  650 mg Oral Q6H PRN Shrivastava, Aryendra, MD   650 mg at 03/26/24 1512   alum & mag hydroxide-simeth (MAALOX/MYLANTA) 200-200-20 MG/5ML suspension 30 mL  30 mL Oral Q4H PRN Shrivastava, Aryendra, MD       buPROPion  (WELLBUTRIN  XL) 24 hr tablet 300 mg  300 mg Oral Daily Ariela Mochizuki, MD   300 mg at 03/27/24 9187    haloperidol  (HALDOL ) tablet 5 mg  5 mg Oral TID PRN Shrivastava, Aryendra, MD       And   diphenhydrAMINE  (BENADRYL ) capsule 50 mg  50 mg Oral TID PRN Shrivastava, Aryendra, MD       haloperidol  lactate (HALDOL ) injection 5 mg  5 mg Intramuscular TID PRN Shrivastava, Aryendra, MD       And   diphenhydrAMINE  (BENADRYL ) injection 50 mg  50 mg Intramuscular TID PRN Shrivastava, Aryendra, MD       And   LORazepam  (ATIVAN ) injection 2 mg  2 mg Intramuscular TID PRN Shrivastava, Aryendra, MD       haloperidol  lactate (HALDOL ) injection 10 mg  10 mg Intramuscular TID PRN Shrivastava, Aryendra, MD       And   diphenhydrAMINE  (BENADRYL ) injection 50 mg  50 mg Intramuscular TID PRN Shrivastava, Aryendra, MD       And   LORazepam  (ATIVAN ) injection 2 mg  2 mg Intramuscular TID PRN Shrivastava, Aryendra, MD       FLUoxetine  (PROZAC ) capsule 20 mg  20 mg Oral Daily Lanise Mergen, MD   20 mg at 03/27/24 9187   lamoTRIgine  (LAMICTAL ) tablet 100 mg  100 mg Oral Daily Raniyah Curenton, MD   100 mg at 03/27/24 9187   magnesium  hydroxide (MILK OF MAGNESIA) suspension 30 mL  30 mL Oral Daily PRN Shrivastava, Aryendra, MD       pantoprazole  (PROTONIX ) EC tablet 40 mg  40 mg Oral Daily Shrivastava, Aryendra, MD   40 mg at 03/27/24 9187    Lab Results:  Results for orders placed or performed during the hospital encounter of 03/24/24 (from the past 48 hours)  HIV Antibody (routine testing w rflx)     Status: None   Collection Time: 03/26/24  8:56 AM  Result Value Ref Range   HIV Screen 4th Generation wRfx Non Reactive Non Reactive    Comment: Performed at [facility], [address], [place], [place] [address]    Blood Alcohol level:  Lab Results  Component Value Date   [facility] <15 03/24/2024    Metabolic Disorder Labs: Lab Results  Component Value Date   HGBA1C 5.2 03/25/2024   MPG 102.54 03/25/2024   No results found for: PROLACTIN Lab Results  Component Value Date   CHOL 107 03/25/2024    TRIG 90 03/25/2024   HDL 43 03/25/2024   CHOLHDL 2.5 03/25/2024   VLDL 18 03/25/2024   LDLCALC 46 03/25/2024    Physical Findings: AIMS:  , ,  ,  ,    CIWA:    COWS:      Psychiatric Specialty Exam:  Presentation  General Appearance:  Casual  Eye Contact:  Good  Speech: Clear and Coherent  Speech Volume: Normal    Mood and Affect  Mood: Euthymic   Affect: Congruent   Thought Process  Thought Processes: Coherent  Descriptions of Associations:Intact  Orientation:Full (Time, Place and Person)  Thought Content:Logical  Hallucinations: Denies  Ideas of Reference:None  Suicidal Thoughts: Denies  Homicidal Thoughts: Denies    Sensorium  Memory: Recent Good; Remote Good; Immediate Good  Judgment: Good  Insight: Good   Executive Functions  Concentration:No data recorded Attention Span: Good  Recall: Good  Fund of Knowledge: Good  Language: Good   Psychomotor Activity  Psychomotor Activity: No data recorded  Musculoskeletal: Strength & Muscle Tone: within normal limits Gait & Station: normal Assets  Assets: Manufacturing systems engineer; Desire for Improvement; Financial Resources/Insurance; Housing; Social Support; Vocational/Educational    Physical Exam: Physical Exam Constitutional:      Appearance: Normal appearance.  HENT:     Head: Normocephalic and atraumatic.     Nose: Nose normal.     Mouth/Throat:     Mouth: Mucous membranes are moist.  Pulmonary:     Effort: Pulmonary effort is normal.  Neurological:     Mental Status: He is alert.  Psychiatric:        Attention and Perception: Attention normal. He does not perceive auditory or visual hallucinations.        Mood and Affect: Mood and affect normal. Mood is not anxious, depressed or elated. Affect is not labile or angry.        Speech: Speech normal.        Behavior: Behavior is cooperative.        Thought Content: Thought content normal. Thought content is not  paranoid or delusional. Thought content does not include homicidal or suicidal ideation. Thought content does not include homicidal or suicidal plan.        Cognition and Memory: Cognition normal.        Judgment: Judgment normal.    Review of Systems  Psychiatric/Behavioral:  Negative for depression, hallucinations and suicidal ideas. The patient is not nervous/anxious and does not have insomnia.   All other systems reviewed and are negative.  Blood pressure (!) 151/94, pulse 63, temperature 98 F (36.7 C), resp. rate 18, height 6' (1.829 m), weight 89.4 kg, SpO2 100%. Body mass index is 26.72 kg/m.  Diagnosis: Principal Problem:   Bipolar II disorder (HCC) Active Problems:   Bipolar affect, depressed (HCC)   PLAN: Safety and Monitoring:  -- Voluntary admission to inpatient psychiatric unit for safety, stabilization and treatment  -- Daily contact with patient to assess and evaluate symptoms and progress in treatment  -- Patient's case to be discussed in multi-disciplinary team meeting  -- Observation Level : q15 minute checks  -- Vital signs:  q12 hours  -- Precautions: suicide, elopement, and assault -- Encouraged patient to participate in unit milieu and in scheduled group therapies  2. Psychiatric Diagnoses and Treatment: Bipolar II disorder, current depressed state   Continue Wellbutrin  XL 300 mg once daily  Continue Prozac  20 mg once daily  Continue Lamictal  100 mg once daily  Discontinued Zyprexa  due to sedation     3. Medical Issues Being Addressed:   Continue Protonix  40 mg    4. Discharge Planning:   -- Social work and case management to assist with discharge planning and identification of hospital follow-up needs prior to discharge  -- Estimated LOS: 3-4 days  Tyleah Loh, MD 03/27/2024, 4:33 PM

## 2024-03-27 NOTE — Progress Notes (Signed)
   03/27/24 0727  Psych Admission Type (Psych Patients Only)  Admission Status Voluntary  Psychosocial Assessment  Patient Complaints None  Eye Contact Fair  Facial Expression Animated  Affect Appropriate to circumstance  Speech Logical/coherent  Interaction Assertive  Motor Activity Slow  Appearance/Hygiene Unremarkable  Behavior Characteristics Cooperative  Mood Pleasant  Thought Process  Coherency WDL  Content WDL  Delusions None reported or observed  Perception WDL  Hallucination None reported or observed  Judgment WDL  Confusion None  Danger to Self  Current suicidal ideation? Denies  Danger to Others  Danger to Others None reported or observed

## 2024-03-28 MED ORDER — BUPROPION HCL ER (XL) 300 MG PO TB24: 300.0000 mg | ORAL_TABLET | Freq: Every day | ORAL | 0 refills | Status: AC

## 2024-03-28 MED ORDER — FLUOXETINE HCL 20 MG PO CAPS: 20.0000 mg | ORAL_CAPSULE | Freq: Every day | ORAL | 0 refills | Status: DC

## 2024-03-28 MED ORDER — CLOPIDOGREL BISULFATE 75 MG PO TABS: 75.0000 mg | ORAL_TABLET | Freq: Every day | ORAL | 0 refills | Status: AC

## 2024-03-28 MED ORDER — LAMOTRIGINE 100 MG PO TABS: 100.0000 mg | ORAL_TABLET | Freq: Every day | ORAL | 0 refills | Status: DC

## 2024-03-28 NOTE — Plan of Care (Addendum)
 Patient is to be discharged today. Paperwork is complete. Home meds obtained from pharmacy.  11:28 a - patient escorted with belongings and paperwork to the medical mall exit for discharge.

## 2024-03-28 NOTE — Progress Notes (Signed)
  [facility] Adult Case Management Discharge Plan :  Will you be returning to the same living situation after discharge:  Yes,  pt will return home  At discharge, do you have transportation home?: Yes,  pt's wife will pick him up  Do you have the ability to pay for your medications: Yes,  MEDICARE / MEDICARE PART A AND B  Release of information consent forms completed and in the chart;  Patient's signature needed at discharge.  Patient to Follow up at:  Follow-up Information     [facility] . Go on 04/10/2024.   Why: Your appointment is scheduled for 04/10/24 at 1:30 PM. Please arrive by 1:00 PM.  They have mailed paperwork for you to complete prior to your scheduled appointment. Please bring those with you.                Next level of care provider has access to [facility] Link:no  Safety Planning and Suicide Prevention discussed: Yes,  Johnney Scarlata, [phone]     Has patient been referred to the Quitline?: Patient does not use tobacco/nicotine products  Patient has been referred for addiction treatment: No known substance use disorder.  [address], LCSWA 03/28/2024, 9:33 AM

## 2024-03-28 NOTE — Care Management Important Message (Signed)
 Important Message  Patient Details  Name: Bruce Chambers. MRN: [id number] Date of Birth: Jun 11, 1961   Important Message Given:  Yes - Medicare IM     Lum JONETTA Croft, LCSWA 03/28/2024, 9:34 AM

## 2024-03-28 NOTE — Plan of Care (Signed)
   Problem: Education: Goal: Emotional status will improve Outcome: Progressing Goal: Mental status will improve Outcome: Progressing   Problem: Activity: Goal: Sleeping patterns will improve Outcome: Progressing

## 2024-03-28 NOTE — Progress Notes (Signed)
   03/27/24 2354  Psych Admission Type (Psych Patients Only)  Admission Status Voluntary  Psychosocial Assessment  Patient Complaints None  Eye Contact Fair  Facial Expression Animated  Affect Appropriate to circumstance  Speech Logical/coherent  Interaction Assertive  Motor Activity Slow  Appearance/Hygiene Unremarkable  Behavior Characteristics Cooperative;Appropriate to situation  Mood Pleasant  Thought Process  Coherency WDL  Content WDL  Delusions None reported or observed  Perception WDL  Hallucination None reported or observed  Judgment WDL  Confusion None  Danger to Self  Current suicidal ideation? Denies  Agreement Not to Harm Self Yes  Description of Agreement verbal  Danger to Others  Danger to Others None reported or observed

## 2024-03-28 NOTE — BHH Suicide Risk Assessment (Signed)
 [facility] Discharge Suicide Risk Assessment   Principal Problem: Bipolar II disorder [facility]) Discharge Diagnoses: Principal Problem:   Bipolar II disorder (HCC) Active Problems:   Bipolar affect, depressed (HCC)   Total Time spent with patient: 30 minutes  Musculoskeletal: Strength & Muscle Tone: within normal limits Gait & Station: normal Patient leans: N/A  Psychiatric Specialty Exam  Presentation  General Appearance:  Appropriate for Environment; Casual  Eye Contact: Fair  Speech: Clear and Coherent  Speech Volume: Normal  Handedness: Right   Mood and Affect  Mood: Euthymic  Duration of Depression Symptoms: Greater than two weeks  Affect: Appropriate   Thought Process  Thought Processes: Coherent  Descriptions of Associations:Intact  Orientation:Full (Time, Place and Person)  Thought Content:Logical  History of Schizophrenia/Schizoaffective disorder:No  Duration of Psychotic Symptoms:No data recorded Hallucinations:Hallucinations: None  Ideas of Reference:None  Suicidal Thoughts:Suicidal Thoughts: No  Homicidal Thoughts:Homicidal Thoughts: No   Sensorium  Memory: Immediate Fair; Recent Fair; Remote Fair  Judgment: Fair  Insight: Fair   Art therapist  Concentration: Fair  Attention Span: Fair  Recall: [employer] of Knowledge: Fair  Language: Fair   Psychomotor Activity  Psychomotor Activity: Psychomotor Activity: Normal   Assets  Assets: Communication Skills; Resilience   Sleep  Sleep: Sleep: Fair  Estimated Sleeping Duration (Last 24 Hours): 7.75-9.00 hours  Physical Exam: Physical Exam ROS Blood pressure (!) 162/95, pulse 60, temperature 97.9 F (36.6 C), resp. rate 16, height 6' (1.829 m), weight 89.4 kg, SpO2 100%. Body mass index is 26.72 kg/m.  Mental Status Per Nursing Assessment::   On Admission:  NA  Demographic Factors:  Male  Loss Factors: Decrease in vocational  status  Historical Factors: Impulsivity  Risk Reduction Factors:   Living with another person, especially a relative, Positive social support, Positive therapeutic relationship, and Positive coping skills or problem solving skills  Continued Clinical Symptoms:  Depression:   Impulsivity  Cognitive Features That Contribute To Risk:  None    Suicide Risk:  Minimal: No identifiable suicidal ideation.  Patients presenting with no risk factors but with morbid ruminations; may be classified as minimal risk based on the severity of the depressive symptoms   Follow-up Information     [facility] . Go on 04/10/2024.   Why: Your appointment is scheduled for 04/10/24 at 1:30 PM. Please arrive by 1:00 PM.  They have mailed paperwork for you to complete prior to your scheduled appointment. Please bring those with you.                Plan Of Care/Follow-up recommendations:  Activity:  As tolerated  Allyn Foil, MD 03/28/2024, 9:36 AM

## 2024-03-28 NOTE — Discharge Summary (Signed)
 Physician Discharge Summary Note  Patient:  Bruce Chambers. is an 63 y.o., male MRN:  [id number] DOB:  1961/03/13 Patient phone:  [phone] (home)  Patient address:   [address] [place] [address],   Total time spent: 40 min Date of Admission:  03/24/2024 Date of Discharge: 03/28/24  Reason for Admission:  Bruce Chambers. is a 63 y.o. male admitted: Presented to the ED Bruce Chambers. is a 63 y.o. male with a history of hypertension, bipolar disorder who comes ED complaining of feeling like his chemicals are out of balance. Reports SI, no self-injurious behaviors thus far. Denies any pain or fever. Psychiatry was consulted for safety evaluation. Patient is admitted to the [facility] unit for further stabilization.   Principal Problem: Bipolar II disorder Bruce Chambers) Discharge Diagnoses: Principal Problem:   Bipolar II disorder (HCC) Active Problems:   Bipolar affect, depressed (HCC)   Past Psychiatric History: see h&p  Family Psychiatric  History: see h&p Social History:  Social History   Substance and Sexual Activity  Alcohol Use Yes   Alcohol/week: 2.0 standard drinks of alcohol   Types: 2 Glasses of wine per week     Social History   Substance and Sexual Activity  Drug Use Never    Social History   Socioeconomic History   Marital status: Single    Spouse name: Not on file   Number of children: Not on file   Years of education: Not on file   Highest education level: Not on file  Occupational History   Not on file  Tobacco Use   Smoking status: Never   Smokeless tobacco: Never  Vaping Use   Vaping status: Never Used  Substance and Sexual Activity   Alcohol use: Yes    Alcohol/week: 2.0 standard drinks of alcohol    Types: 2 Glasses of wine per week   Drug use: Never   Sexual activity: Not on file  Other Topics Concern   Not on file  Social History Narrative   Not on file   Social Drivers of Health   Financial Resource Strain: Patient  Declined (02/20/2024)   [facility] from [facility]   Overall Financial Resource Strain (CARDIA)    Difficulty of Paying Living Expenses: Patient declined  Food Insecurity: No Food Insecurity (03/24/2024)   Hunger Vital Sign    Worried About Running Out of Food in the Last Year: Never true    Ran Out of Food in the Last Year: Never true  Transportation Needs: No Transportation Needs (03/24/2024)   PRAPARE - Administrator, Civil Service (Medical): No    Lack of Transportation (Non-Medical): No  Physical Activity: Not on file  Stress: Not on file  Social Connections: Unknown (03/24/2024)   Social Connection and Isolation Panel    Frequency of Communication with Friends and Family: Not on file    Frequency of Social Gatherings with Friends and Family: Not on file    Attends Religious Services: Not on file    Active Member of Clubs or Organizations: Not on file    Attends Banker Meetings: Not on file    Marital Status: Married   Past Medical History:  Past Medical History:  Diagnosis Date   Bipolar 1 disorder (HCC)    Dysplastic nevus 11/29/2023   Right infrascapular, moderate atypia   GERD (gastroesophageal reflux disease)    Hypertension    Psoriasis    PVC (premature ventricular contraction)  History reviewed. No pertinent surgical history. Family History:  Family History  Problem Relation Age of Onset   Emphysema Mother    Atrial fibrillation Father    Atrial fibrillation Sister     Chambers Course:  Bruce Anspach. is a 63 y.o. male admitted: Presented to the ED Bruce Chambers. is a 63 y.o. male with a history of hypertension, bipolar disorder who comes ED complaining of feeling like his chemicals are out of balance. Reports SI, no self-injurious behaviors thus far. Denies any pain or fever. Psychiatry was consulted for safety evaluation. Patient is admitted to the [facility] unit for further stabilization.  Detailed risk assessment  is complete based on clinical exam and individual risk factors and acute suicide risk is low and acute violence risk is low.     On admission patient was continued on his home medications of Wellbutrin  XL 300 mg daily, Lamictal  was optimized to 100 mg daily, Prozac  was added to optimize the antidepressant effect.  Patient maintained safe behaviors on the unit.  On the day of discharge he consistently denied SI/HI/plan and denied hallucinations.  He remains future oriented and is willing to participate in outpatient mental health services.  Currently, all modifiable risk of harm to self/harm to others have been addressed and patient is no longer appropriate for the acute inpatient setting and is able to continue treatment for mental health needs in the community with the supports as indicated below.  Patient is educated and verbalized understanding of discharge plan of care including medications, follow-up appointments, mental health resources and further crisis services in the community.  He is instructed to call 911 or present to the nearest emergency room should he experience any decompensation in mood, disturbance of bowel or return of suicidal/homicidal ideations.  Patient verbalizes understanding of this education and agrees to this plan of care  Physical Findings: AIMS:  , ,  ,  ,    CIWA:    COWS:        Psychiatric Specialty Exam:  Presentation  General Appearance:  Appropriate for Environment; Casual  Eye Contact: Fair  Speech: Clear and Coherent  Speech Volume: Normal    Mood and Affect  Mood: Euthymic  Affect: Appropriate   Thought Process  Thought Processes: Coherent  Descriptions of Associations:Intact  Orientation:Full (Time, Place and Person)  Thought Content:Logical  Hallucinations:Hallucinations: None  Ideas of Reference:None  Suicidal Thoughts:Suicidal Thoughts: No  Homicidal Thoughts:Homicidal Thoughts: No   Sensorium  Memory: Immediate  Fair; Recent Fair; Remote Fair  Judgment: Fair  Insight: Fair   Art therapist  Concentration: Fair  Attention Span: Fair  Recall: [employer] of Knowledge: Fair  Language: Fair   Psychomotor Activity  Psychomotor Activity: Psychomotor Activity: Normal  Musculoskeletal: Strength & Muscle Tone: within normal limits Gait & Station: normal Assets  Assets: Manufacturing systems engineer; Resilience   Sleep  Sleep: Sleep: Fair    Physical Exam: Physical Exam ROS Blood pressure (!) 162/95, pulse 60, temperature 97.9 F (36.6 C), resp. rate 16, height 6' (1.829 m), weight 89.4 kg, SpO2 100%. Body mass index is 26.72 kg/m.   Social History   Tobacco Use  Smoking Status Never  Smokeless Tobacco Never   Tobacco Cessation:  N/A, patient does not currently use tobacco products   Blood Alcohol level:  Lab Results  Component Value Date   [facility] <15 03/24/2024    Metabolic Disorder Labs:  Lab Results  Component Value Date   HGBA1C 5.2 03/25/2024  MPG 102.54 03/25/2024   No results found for: PROLACTIN Lab Results  Component Value Date   CHOL 107 03/25/2024   TRIG 90 03/25/2024   HDL 43 03/25/2024   CHOLHDL 2.5 03/25/2024   VLDL 18 03/25/2024   LDLCALC 46 03/25/2024    See Psychiatric Specialty Exam and Suicide Risk Assessment completed by Attending Physician prior to discharge.  Discharge destination:  Home  Is patient on multiple antipsychotic therapies at discharge:  No   Has Patient had three or more failed trials of antipsychotic monotherapy by history:  No  Recommended Plan for Multiple Antipsychotic Therapies: NA   Allergies as of 03/28/2024       Reactions   Rosuvastatin Other (See Comments)        Medication List     TAKE these medications      Indication  Adalimumab -adaz 40 MG/0.4ML Soaj Commonly known as: Hyrimoz  Inject 1 Pen into the skin every 14 (fourteen) days.    aspirin  EC 81 MG tablet Take 81 mg by mouth  daily.    buPROPion  300 MG 24 hr tablet Commonly known as: WELLBUTRIN  XL Take 1 tablet (300 mg total) by mouth daily. What changed:  medication strength how much to take  Indication: Major Depressive Disorder   clopidogrel  75 MG tablet Commonly known as: PLAVIX  Take 1 tablet (75 mg total) by mouth daily.  Indication: Disease of the Peripheral Arteries   FLUoxetine  20 MG capsule Commonly known as: PROZAC  Take 1 capsule (20 mg total) by mouth daily.  Indication: Depression   lamoTRIgine  100 MG tablet Commonly known as: LAMICTAL  Take 1 tablet (100 mg total) by mouth daily. What changed:  medication strength how much to take Another medication with the same name was removed. Continue taking this medication, and follow the directions you see here.  Indication: Manic-Depression   NexIUM 20 MG capsule Generic drug: esomeprazole Take 20 mg by mouth daily at 12 noon.    Zoryve  0.3 % Crea Generic drug: Roflumilast  Apply topically to affected areas of psoriasis as needed daily         Follow-up Information     [facility] . Go on 04/10/2024.   Why: Your appointment is scheduled for 04/10/24 at 1:30 PM. Please arrive by 1:00 PM.  They have mailed paperwork for you to complete prior to your scheduled appointment. Please bring those with you.                Follow-up recommendations:  Activity:  As tolerated    Signed: Laurissa Cowper, MD 03/28/2024, 9:37 AM

## 2024-03-28 NOTE — B[facility] Group Notes (Signed)
 Spirituality Group   Group Goal: Support / Education around grief and loss  **Also engaged topic of forgiveness, healing family wounds  Group Description: Following introductions and group rules, group members engaged in facilitated group dialog and support around topic of loss, with particular support around experiences of loss in their lives. Group members identified types of loss (relationships / self / things) as well as patterns, circumstances, and changes that precipitate loss. Reflection invited on thoughts / feelings around loss, normalized grief responses, and recognized variety in grief experience. Group noted Worden's four tasks of grief in discussion. Group drew on Adlerian / Rogerian, narrative, MI, with Yalom's group therapy as a primary framework.   Observations: Bruce Chambers was very engaged in the group discussion. He shared with openness and vulnerability helping foster group trust and mutual empathy among peers.  Bruce Chambers L. [name] HERO.Div

## 2024-03-28 NOTE — Group Note (Signed)
 Date:  03/28/2024 Time:  11:22 AM  Group Topic/Focus:  Building Self Esteem:   The Focus of this group is helping patients become aware of the effects of self-esteem on their lives, the things they and others do that enhance or undermine their self-esteem, seeing the relationship between their level of self-esteem and the choices they make and learning ways to enhance self-esteem.    Participation Level:  Active  Participation Quality:  Appropriate  Affect:  Appropriate  Cognitive:  Appropriate  Insight: Appropriate  Engagement in Group:  Engaged  Modes of Intervention:  Discussion   Bruce Chambers 03/28/2024, 11:22 AM

## 2024-04-05 ENCOUNTER — Encounter: Payer: Self-pay | Admitting: Orthopaedic Surgery

## 2024-04-08 ENCOUNTER — Encounter: Payer: Self-pay | Admitting: Orthopaedic Surgery

## 2024-04-10 ENCOUNTER — Encounter: Payer: Self-pay | Admitting: Psychiatry

## 2024-04-10 ENCOUNTER — Ambulatory Visit (INDEPENDENT_AMBULATORY_CARE_PROVIDER_SITE_OTHER): Admitting: Psychiatry

## 2024-04-10 VITALS — BP 148/102 | HR 97 | Temp 98.8°F | Ht 71.5 in | Wt 196.0 lb

## 2024-04-10 DIAGNOSIS — F314 Bipolar disorder, current episode depressed, severe, without psychotic features: Secondary | ICD-10-CM | POA: Diagnosis not present

## 2024-04-10 MED ORDER — FLUOXETINE HCL 10 MG PO CAPS: 10.0000 mg | ORAL_CAPSULE | Freq: Every day | ORAL | 0 refills | Status: DC

## 2024-04-10 MED ORDER — LUMATEPERONE TOSYLATE 42 MG PO CAPS: 42.0000 mg | ORAL_CAPSULE | Freq: Every day | ORAL | Status: DC

## 2024-04-10 MED ORDER — LUMATEPERONE TOSYLATE 42 MG PO CAPS: 42.0000 mg | ORAL_CAPSULE | Freq: Every day | ORAL | 2 refills | Status: DC

## 2024-04-10 NOTE — Progress Notes (Unsigned)
 Psychiatric Initial Adult Assessment   Patient Identification: Bruce Chambers. MRN:  [id number] Date of Evaluation:  04/10/2024 Referral Source: Alla MD Chief Complaint:  New Patient Visit Visit Diagnosis:    ICD-10-CM   1. Bipolar disorder, current episode depressed, severe, without psychotic features (HCC)  F31.4       History of Present Illness: 63 year old male presenting [facility] for follow-up.  Patient reports that he was recently admitted in September for depressive symptoms stating that he is knows he has bipolar disorder but states that he will enter this black hole once in a while with the last one when he was hospitalized and is now stating he is now in a new episode of currently being depressed.  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  Patient reports that he is doing very well in his job and states that he is just needing to take care of himself better stating that he is not feeling the joy or passion that he wants had.  Patient reports that he continues to want to address his depression and actually ended up going to the hospital stating he was suicidal ideation without being suicidal just so he could see someone quickly to adjust his meds.  Patient reports he was placed on fluoxetine  20 mg to help manage his depression which he states is a poor response.  Patient continues stating that he would like to develop a relationship with a psychiatric provider to help manage and keep him out of this depressive episode so he can enjoy his life with his wife at home and be able to be productive at his business.  Based on this assessment interview it is recommended for the patient to please decrease fluoxetine  to 10 mg once daily.  Patient is also recommended to start Caplyta  42 mg once daily.  Patient has been instructed that this medication is to help adjunct  his Lamictal  and Wellbutrin  for anhedonia and to see if this could help manage his bipolar depression.  Patient is in agreement with treatment plan.  Patient has been educated to take this medication at night due to possible sedation.  Patient with no other questions or concerns at this time patient is in agreement with treatment plan.  Patient to follow-up in 2 weeks.  Patient denies SI, HI, AVH.  Associated Signs/Symptoms: Depression Symptoms:  anhedonia, fatigue, hopelessness, (Hypo) Manic Symptoms: Negative Anxiety Symptoms: Negative Psychotic Symptoms: Negative PTSD Symptoms: Negative  Past Psychiatric History: Previous Psych Hospitalizations: - 03/24/24 for SI, [facility] for 2 days Outpatient treatment:  - Current PCP Linthavong MD Medications Current: - Lamictal  100mg  once daily - Wellbutrin  XL 300mg  once daily - Caplyta  42mg  once daily Next Steps: - Monitor depressive symptoms Medication Trials: - Fluoxetine  - poor response Suicide & Violence: - Denies Substance Use: - Denies Psychotherapy: - None recommended Legal:  - Denies Previous Psychotropic Medications: Yes   Substance Abuse History in the last 12 months:  No.  Consequences of Substance Abuse: Negative  Past Medical History:  Past Medical History:  Diagnosis Date   Bipolar 1 disorder (HCC)    Dysplastic nevus 11/29/2023   Right infrascapular, moderate atypia   GERD (gastroesophageal reflux disease)    Hypertension    Psoriasis    PVC (premature ventricular contraction)    No past surgical history on file.  Family Psychiatric History: No additional  Family History:  Family History  Problem Relation Age of Onset   Emphysema Mother    Atrial fibrillation Father    Atrial fibrillation Sister     Social History:   Social History   Socioeconomic History   Marital status: Single    Spouse name: Not on file   Number of children: Not on file   Years of education: Not on file   Highest education  level: Not on file  Occupational History   Not on file  Tobacco Use   Smoking status: Never   Smokeless tobacco: Never  Vaping Use   Vaping status: Never Used  Substance and Sexual Activity   Alcohol use: Not Currently    Alcohol/week: 2.0 standard drinks of alcohol    Types: 2 Glasses of wine per week    Comment: Not in the past 2 weeks, prior to that 2 glasses of wine a day.   Drug use: Never   Sexual activity: Yes    Partners: Female  Other Topics Concern   Not on file  Social History Narrative   Not on file   Social Drivers of Health   Financial Resource Strain: Patient Declined (02/20/2024)   Received from [facility]   Overall Financial Resource Strain (CARDIA)    Difficulty of Paying Living Expenses: Patient declined  Food Insecurity: No Food Insecurity (03/24/2024)   Hunger Vital Sign    Worried About Running Out of Food in the Last Year: Never true    Ran Out of Food in the Last Year: Never true  Transportation Needs: No Transportation Needs (03/24/2024)   PRAPARE - Administrator, Civil Service (Medical): No    Lack of Transportation (Non-Medical): No  Physical Activity: Not on file  Stress: Not on file  Social Connections: Unknown (03/24/2024)   Social Connection and Isolation Panel    Frequency of Communication with Friends and Family: Not on file    Frequency of Social Gatherings with Friends and Family: Not on file    Attends Religious Services: Not on file    Active Member of Clubs or Organizations: Not on file    Attends Banker Meetings: Not on file    Marital Status: Married    Additional Social History: No additional  Allergies:   Allergies  Allergen Reactions   Rosuvastatin Other (See Comments)   Other Rash    Allergic  to Red Peppers    Metabolic Disorder Labs: Lab Results  Component Value Date   HGBA1C 5.2 03/25/2024   MPG 102.54 03/25/2024   No results found for: PROLACTIN Lab Results   Component Value Date   CHOL 107 03/25/2024   TRIG 90 03/25/2024   HDL 43 03/25/2024   CHOLHDL 2.5 03/25/2024   VLDL 18 03/25/2024   LDLCALC 46 03/25/2024   Lab Results  Component Value Date   TSH 1.192 03/25/2024    Therapeutic Level Labs: No results found for: LITHIUM No results found for: CBMZ No results found for: VALPROATE  Current Medications: Current Outpatient Medications  Medication Sig Dispense Refill   Adalimumab -adaz (HYRIMOZ ) 40 MG/0.4ML SOAJ Inject 1 Pen into the skin every 14 (fourteen) days. 0.8 mL 5   buPROPion  (WELLBUTRIN  XL) 300 MG 24 hr tablet Take 1 tablet (300 mg total) by mouth daily. 30 tablet 0   clopidogrel  (PLAVIX ) 75 MG tablet Take 1 tablet (75 mg total) by mouth daily. 30 tablet 0   esomeprazole (NEXIUM) 20 MG capsule Take  20 mg by mouth daily at 12 noon.     Evolocumab with Infusor (REPATHA PUSHTRONEX SYSTEM) 420 MG/3.5ML SOCT Inject 3.5 mLs into the skin every 14 (fourteen) days.     FLUoxetine  (PROZAC ) 10 MG capsule Take 1 capsule (10 mg total) by mouth daily. 14 capsule 0   lamoTRIgine  (LAMICTAL ) 100 MG tablet Take 1 tablet (100 mg total) by mouth daily. 30 tablet 0   lumateperone  tosylate (CAPLYTA ) 42 MG capsule Take 1 capsule (42 mg total) by mouth daily. 14 capsule    lumateperone  tosylate (CAPLYTA ) 42 MG capsule Take 1 capsule (42 mg total) by mouth daily. 30 capsule 2   aspirin  EC 81 MG tablet Take 81 mg by mouth daily. (Patient not taking: Reported on 04/10/2024)     Roflumilast  (ZORYVE ) 0.3 % CREA Apply topically to affected areas of psoriasis as needed daily (Patient not taking: Reported on 04/10/2024) 60 g 3   No current facility-administered medications for this visit.    Musculoskeletal: Strength & Muscle Tone: within normal limits Gait & Station: normal Patient leans: N/A  Psychiatric Specialty Exam: Review of Systems  Constitutional: Negative.   HENT: Negative.    Eyes: Negative.   Respiratory: Negative.     Cardiovascular: Negative.   Gastrointestinal: Negative.   Endocrine: Negative.   Genitourinary: Negative.   Musculoskeletal: Negative.   Skin: Negative.   Allergic/Immunologic: Negative.   Neurological: Negative.   Hematological: Negative.   Psychiatric/Behavioral:  Positive for dysphoric mood.     Blood pressure (!) 148/102, pulse 97, temperature 98.8 F (37.1 C), height 5' 11.5 (1.816 m), weight 196 lb (88.9 kg), SpO2 97%.Body mass index is 26.96 kg/m.  General Appearance: Well Groomed  Eye Contact:  Good  Speech:  Clear and Coherent  Volume:  Normal  Mood:  Depressed  Affect:  Appropriate  Thought Process:  Coherent  Orientation:  Full (Time, Place, and Person)  Thought Content:  Logical  Suicidal Thoughts:  No  Homicidal Thoughts:  No  Memory:  Immediate;   Good Recent;   Good Remote;   Good  Judgement:  Good  Insight:  Good  Psychomotor Activity:  Normal  Concentration:  Concentration: Good and Attention Span: Good  Recall:  Good  Fund of Knowledge:Good  Language: Good  Akathisia:  No  Handed:  Right  AIMS (if indicated):  not done  Assets:  Desire for Improvement Financial Resources/Insurance Housing  ADL's:  Intact  Cognition: WNL  Sleep:  Good   Screenings: AUDIT    Flowsheet Row Admission (Discharged) from 03/24/2024 in [facility]  Alcohol Use Disorder Identification Test Final Score (AUDIT) 4   GAD-7    Flowsheet Row Office Visit from 04/10/2024 in [facility]  Total GAD-7 Score 5   PHQ2-9    Flowsheet Row [facility]10/20[facility] Office Visit from 11/06/2023 in [facility] [place]  PHQ-2 Total Score 3 0  PHQ-9 Total Score 11 --   Flowsheet Row Office Visit from 04/10/2024 in [facility] Most recent reading at 04/10/2024  1:36 PM  Admission (Discharged) from 03/24/2024 in [facility] Most recent reading at 03/24/2024  5:00 PM ED from 03/24/2024 in [facility] Department at [facility] Most recent reading at 03/24/2024  2:50 PM  C-SSRS RISK CATEGORY Moderate Risk Moderate Risk Moderate Risk  Assessment and Plan:  Assessment - Diagnosis: Bipolar disorder, current episode depressed, severe, without psychotic features (HCC) [F31.4]   Differential Diagnosis: Schizoaffective  - Progress: Baseline - Risk Factors: Worsening symptoms, SI  Plan - Medications:   Continue Wellbutrin  XL 300mg  once daily.  Continue Lamotrigine  100mg  once daily Decrease Fluoxetine  to 10mg  once daily Start Caplyta  42mg  once daily  - Psychotherapy: Not recommended at this time - Education: Patient has been educated on how to reach this provider by contacting the clinic by phone or by utilizing [employer].  Patient has been educated on medications, side effects, adverse reactions, purpose, and dosage. - Follow-Up: Patient to follow-up in 2 weeks. - Referrals: No referrals at this time. - Safety Planning: The patient has been educated, if they should have suicidal thoughts with or without a plan to call 911, or go to the closest emergency department.  Pt verbalized understanding.  Pt denies firearms within the home.  Pt also agrees to call the clinic should they have worsening symptoms before the next appointment.     Patient/Guardian was advised Release of Information must be obtained prior to any record release in order to collaborate their care with an outside provider. Patient/Guardian was advised if they have not already done so to contact the registration department to sign all necessary forms in order for us  to release information regarding their care.   Consent: Patient/Guardian gives verbal consent for treatment and assignment of benefits for services provided during this visit. Patient/Guardian  expressed understanding and agreed to proceed.   Dorn Jama Der, NP 9/24/20253:59 PM

## 2024-04-15 ENCOUNTER — Other Ambulatory Visit: Payer: Self-pay

## 2024-04-15 MED ORDER — ADALIMUMAB-ADAZ 40 MG/0.4ML ~~LOC~~ SOAJ: 1.0000 | SUBCUTANEOUS | 5 refills | Status: DC

## 2024-04-16 ENCOUNTER — Encounter (HOSPITAL_BASED_OUTPATIENT_CLINIC_OR_DEPARTMENT_OTHER): Payer: Self-pay

## 2024-04-16 ENCOUNTER — Telehealth: Payer: Self-pay

## 2024-04-16 MED ORDER — HUMIRA (2 PEN) 40 MG/0.4ML ~~LOC~~ AJKT: 40.0000 mg | AUTO-INJECTOR | SUBCUTANEOUS | 2 refills | Status: DC

## 2024-04-16 NOTE — Telephone Encounter (Signed)
 [employer]  sent in and left detailed message for patient regarding RX change and pharmacy change. aw

## 2024-04-16 NOTE — Telephone Encounter (Signed)
 Patient's Hyrimoz  has been denied by insurance.   He recently changed to Medicare and [employer] so there was insurance changes.   OK to switch back to [employer]  to see if we can get this covered again?

## 2024-04-17 ENCOUNTER — Other Ambulatory Visit: Payer: Self-pay | Admitting: Psychiatry

## 2024-04-17 NOTE — Progress Notes (Signed)
`   Six 63-year-old male called this provider stating that he is having reactions and side effects to Caplyta  42 mg.  Patient reports sexual dysfunction as well as intolerance to hot or cold and stating drowsiness and dizziness.  Patient when asked to stop the medication and has been asked to increase Lamictal  250 mg once daily while stopping the medication.  Patient verbalized understanding and stated he will follow-up in 1 week.

## 2024-04-22 ENCOUNTER — Telehealth: Payer: Self-pay | Admitting: Psychiatry

## 2024-04-23 ENCOUNTER — Ambulatory Visit (INDEPENDENT_AMBULATORY_CARE_PROVIDER_SITE_OTHER): Admitting: Psychiatry

## 2024-04-23 ENCOUNTER — Other Ambulatory Visit: Payer: Self-pay | Admitting: Psychiatry

## 2024-04-23 DIAGNOSIS — F314 Bipolar disorder, current episode depressed, severe, without psychotic features: Secondary | ICD-10-CM

## 2024-04-23 MED ORDER — LAMOTRIGINE 100 MG PO TABS: 150.0000 mg | ORAL_TABLET | Freq: Every day | ORAL | 2 refills | Status: DC

## 2024-04-23 NOTE — Telephone Encounter (Signed)
 Faxed cardiology Clearance request to Dr. Debby.

## 2024-04-23 NOTE — Progress Notes (Signed)
 [facility] MD/PA/NP OP Progress Note  04/23/2024 3:37 PM Bruce W Henion Jr.  MRN:  [id number]  Chief Complaint: Routine Follow-up HPI: 63 year old male presenting [facility] for follow-up.  Patient reports urgently stating that his depression is not being managed well and that he is experiencing a lot of side effects from Caplyta .  Patient reports that he is experiencing as black hole reporting that he is not feeling good stating that for the since starting the medication on the first day he felt great but thereafter it was not feeling good and states that he has had to split the pill to take 21 mg twice a day to help manage symptoms.  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  Patient reports that he has been having a lot of drowsiness with the medication in which he is not allowing himself to drive anymore and is having being driven by his family.  Patient reports that he feels uneasy about these symptoms and states that he would like to find some relief.  Patient is optimistic that the medications will help and is reports that the only side effect is the drowsiness as well as some the numbness that he is experiencing.  Patient based on this report is recommended for Spravato and patient has been educated on Spravato and the possible treatment options.  Patient verbalized understanding and agreement and states he was he is interested in Spravato.  Patient has [facility]  Patient has been requested to follow-up after referral was sent today.  To schedule an appointment.  Patient with no other questions or concerns at this time.  Patient has stopped fluoxetine .  Patient is continuing Caplyta  21 mg once daily.  Patient also is continuing Lamictal  150 mg once daily and Wellbutrin  100 mg once daily.  Patient with no other questions or concerns at this time.  Patient denies SI, HI, AVH.  Patient to follow-up in 1  week. Visit Diagnosis:    ICD-10-CM   1. Bipolar disorder, current episode depressed, severe, without psychotic features (HCC)  F31.4       Past Psychiatric History:  Previous Psych Hospitalizations: - 03/24/24 for SI, [facility] for 2 days Outpatient treatment:  - Current PCP Linthavong MD Medications Current: - Lamictal  100mg  once daily - Wellbutrin  XL 300mg  once daily - Caplyta  42mg  once daily Next Steps: - Monitor depressive symptoms Medication Trials: - Fluoxetine  - poor response Suicide & Violence: - Denies Substance Use: - Denies Psychotherapy: - None recommended Legal:  - Denies  Past Medical History:  Past Medical History:  Diagnosis Date   Bipolar 1 disorder (HCC)    Dysplastic nevus 11/29/2023   Right infrascapular, moderate atypia   GERD (gastroesophageal reflux disease)    Hypertension    Psoriasis    PVC (premature ventricular contraction)    No past surgical history on file.  Family Psychiatric History: No additional  Family History:  Family History  Problem Relation Age of Onset   Emphysema Mother    Atrial fibrillation Father    Atrial fibrillation Sister     Social History:  Social History   Socioeconomic History   Marital status: Single    Spouse name: Not on file   Number of children: Not on file   Years of education: Not on file   Highest education level: Not on file  Occupational History   Not on file  Tobacco Use  Smoking status: Never   Smokeless tobacco: Never  Vaping Use   Vaping status: Never Used  Substance and Sexual Activity   Alcohol use: Not Currently    Alcohol/week: 2.0 standard drinks of alcohol    Types: 2 Glasses of wine per week    Comment: Not in the past 2 weeks, prior to that 2 glasses of wine a day.   Drug use: Never   Sexual activity: Yes    Partners: Female  Other Topics Concern   Not on file  Social History Narrative   Not on file   Social Drivers of Health   Financial Resource Strain: Patient  Declined (02/20/2024)   Received from [facility]   Overall Financial Resource Strain (CARDIA)    Difficulty of Paying Living Expenses: Patient declined  Food Insecurity: No Food Insecurity (03/24/2024)   Hunger Vital Sign    Worried About Running Out of Food in the Last Year: Never true    Ran Out of Food in the Last Year: Never true  Transportation Needs: No Transportation Needs (03/24/2024)   PRAPARE - Administrator, Civil Service (Medical): No    Lack of Transportation (Non-Medical): No  Physical Activity: Not on file  Stress: Not on file  Social Connections: Unknown (03/24/2024)   Social Connection and Isolation Panel    Frequency of Communication with Friends and Family: Not on file    Frequency of Social Gatherings with Friends and Family: Not on file    Attends Religious Services: Not on file    Active Member of Clubs or Organizations: Not on file    Attends Banker Meetings: Not on file    Marital Status: Married    Allergies:  Allergies  Allergen Reactions   Rosuvastatin Other (See Comments)   Other Rash    Allergic  to Red Peppers    Metabolic Disorder Labs: Lab Results  Component Value Date   HGBA1C 5.2 03/25/2024   MPG 102.54 03/25/2024   No results found for: PROLACTIN Lab Results  Component Value Date   CHOL 107 03/25/2024   TRIG 90 03/25/2024   HDL 43 03/25/2024   CHOLHDL 2.5 03/25/2024   VLDL 18 03/25/2024   LDLCALC 46 03/25/2024   Lab Results  Component Value Date   TSH 1.192 03/25/2024   TSH 1.860 10/10/2023    Therapeutic Level Labs: No results found for: LITHIUM No results found for: VALPROATE No results found for: CBMZ  Current Medications: Current Outpatient Medications  Medication Sig Dispense Refill   adalimumab  (HUMIRA , 2 PEN,) 40 MG/0.4ML pen Inject 0.4 mLs (40 mg total) into the skin every 14 (fourteen) days. For maintenance. 2 each 2   Adalimumab -adaz (HYRIMOZ ) 40 MG/0.4ML SOAJ  Inject 1 Pen into the skin every 14 (fourteen) days. 0.8 mL 5   aspirin  EC 81 MG tablet Take 81 mg by mouth daily. (Patient not taking: Reported on 04/10/2024)     buPROPion  (WELLBUTRIN  XL) 300 MG 24 hr tablet Take 1 tablet (300 mg total) by mouth daily. 30 tablet 0   clopidogrel  (PLAVIX ) 75 MG tablet Take 1 tablet (75 mg total) by mouth daily. 30 tablet 0   esomeprazole (NEXIUM) 20 MG capsule Take 20 mg by mouth daily at 12 noon.     Evolocumab with Infusor (REPATHA PUSHTRONEX SYSTEM) 420 MG/3.5ML SOCT Inject 3.5 mLs into the skin every 14 (fourteen) days.     lamoTRIgine  (LAMICTAL ) 100 MG tablet Take 1.5 tablets (150 mg  total) by mouth daily. 30 tablet 2   Roflumilast  (ZORYVE ) 0.3 % CREA Apply topically to affected areas of psoriasis as needed daily (Patient not taking: Reported on 04/10/2024) 60 g 3   No current facility-administered medications for this visit.     Psychiatric Specialty Exam: Review of Systems  Constitutional: Negative.   HENT: Negative.    Eyes: Negative.   Respiratory: Negative.    Cardiovascular: Negative.   Gastrointestinal: Negative.   Endocrine: Negative.   Genitourinary: Negative.   Musculoskeletal: Negative.   Skin: Negative.   Allergic/Immunologic: Negative.   Neurological: Negative.   Hematological: Negative.   Psychiatric/Behavioral:  Positive for dysphoric mood.     No vital collected on this visit.   General Appearance: Well Groomed  Eye Contact:  Good  Speech:  Clear and Coherent  Volume:  Normal  Mood:  Depressed  Affect:  Appropriate  Thought Process:  Coherent  Orientation:  Full (Time, Place, and Person)  Thought Content:  Logical  Suicidal Thoughts:  No  Homicidal Thoughts:  No  Memory:  Immediate;   Good Recent;   Good Remote;   Good  Judgement:  Good  Insight:  Good  Psychomotor Activity:  Normal  Concentration:  Concentration: Good and Attention Span: Good  Recall:  Good  Fund of Knowledge:Good  Language: Good  Akathisia:  No   Handed:  Right  AIMS (if indicated):  not done  Assets:  Desire for Improvement Financial Resources/Insurance Housing  ADL's:  Intact  Cognition: WNL  Sleep:  Good   Screenings: AUDIT    Flowsheet Row Admission (Discharged) from 03/24/2024 in [facility]  Alcohol Use Disorder Identification Test Final Score (AUDIT) 4   GAD-7    Flowsheet Row Office Visit from 04/10/2024 in [facility]  Total GAD-7 Score 5   PHQ2-9    Flowsheet Row Office Visit from 04/10/2024 in [facility] Office Visit from 11/06/2023 in [facility]  PHQ-2 Total Score 3 0  PHQ-9 Total Score 11 --   Flowsheet Row Office Visit from 04/10/2024 in [facility] Most recent reading at 04/10/2024  1:36 PM Admission (Discharged) from 03/24/2024 in [facility] Most recent reading at 03/24/2024  5:00 PM ED from 03/24/2024 in [facility] at [facility] Most recent reading at 03/24/2024  2:50 PM  C-SSRS RISK CATEGORY Moderate Risk Moderate Risk Moderate Risk     Assessment and Plan: . Assessment - Diagnosis: Bipolar disorder, current episode depressed, severe, without psychotic features (HCC) [F31.4]    Differential Diagnosis: Schizoaffective  - Progress: Baseline - Risk Factors: Worsening symptoms, SI  Plan - Medications:   Continue Wellbutrin  XL 300mg  once daily.  Continue Lamotrigine  100mg  once daily Stop Fluoxetine  Decrease Caplyta  21mg  once daily Referral to Spravato, at [employer]  - Psychotherapy: Not recommended at this time - Education: Patient has been educated on how to reach this provider by contacting the clinic by phone or by utilizing [employer].  Patient has been educated on medications, side effects, adverse reactions, purpose, and  dosage. - Follow-Up: Patient to follow-up in 2 weeks. - Referrals: No referrals at this time. - Safety Planning: The patient has been educated, if they should have suicidal thoughts with or without a plan to call 911, or go to the closest emergency department.  Pt verbalized understanding.  Pt denies firearms within the home.  Pt also agrees to call the clinic should they have worsening symptoms before the next appointment.       Patient/Guardian was advised Release of Information must be obtained prior to any record release in order to collaborate their care with an outside provider. Patient/Guardian was advised if they have not already done so to contact the registration department to sign all necessary forms in order for us  to release information regarding their care.   Consent: Patient/Guardian gives verbal consent for treatment and assignment of benefits for services provided during this visit. Patient/Guardian expressed understanding and agreed to proceed.    Dorn Jama Der, NP 04/23/2024, 3:37 PM

## 2024-04-24 ENCOUNTER — Ambulatory Visit: Admitting: Psychiatry

## 2024-04-25 ENCOUNTER — Telehealth: Payer: Self-pay | Admitting: Psychiatry

## 2024-04-30 ENCOUNTER — Ambulatory Visit: Admitting: Psychiatry

## 2024-05-20 ENCOUNTER — Telehealth: Payer: Self-pay | Admitting: Psychiatry

## 2024-05-20 ENCOUNTER — Telehealth: Payer: Self-pay | Admitting: Orthopaedic Surgery

## 2024-05-20 ENCOUNTER — Other Ambulatory Visit: Payer: Self-pay | Admitting: Orthopaedic Surgery

## 2024-05-20 ENCOUNTER — Encounter: Payer: Self-pay | Admitting: Radiology

## 2024-05-20 MED ORDER — TIZANIDINE HCL 2 MG PO TABS: 2.0000 mg | ORAL_TABLET | Freq: Three times a day (TID) | ORAL | 0 refills | Status: DC | PRN

## 2024-05-20 MED ORDER — TRAMADOL HCL 50 MG PO TABS: 50.0000 mg | ORAL_TABLET | Freq: Two times a day (BID) | ORAL | 0 refills | Status: DC | PRN

## 2024-05-20 NOTE — Telephone Encounter (Signed)
 Patient called and said being that he is in bad pain and he is on blood thinners will that conflict with the surgery without waiting for a year. CB#[phone]

## 2024-05-22 ENCOUNTER — Telehealth (HOSPITAL_COMMUNITY): Payer: Self-pay | Admitting: Licensed Clinical Social Worker

## 2024-05-22 NOTE — Telephone Encounter (Signed)
 Cln met with pt and his wife at [facility] office per pt and wife's request via walk-in. Pt's wife reports pt is seeking PHP. Cln confirmed with pt and oriented both to PHP. Pt agreed to [facility] and confirmed availability for Monday, 11/10 at 10 am. Pt and his wife verbalized understanding that the program is in-person while the assessment is virtual and will be via MyChart.

## 2024-05-27 ENCOUNTER — Telehealth (HOSPITAL_COMMUNITY): Payer: Self-pay | Admitting: Licensed Clinical Social Worker

## 2024-05-27 ENCOUNTER — Ambulatory Visit (HOSPITAL_COMMUNITY)

## 2024-05-27 NOTE — Telephone Encounter (Signed)
 See call log

## 2024-06-03 ENCOUNTER — Ambulatory Visit: Admitting: Dermatology

## 2024-06-03 DIAGNOSIS — L409 Psoriasis, unspecified: Secondary | ICD-10-CM

## 2024-06-03 DIAGNOSIS — L578 Other skin changes due to chronic exposure to nonionizing radiation: Secondary | ICD-10-CM | POA: Diagnosis not present

## 2024-06-03 DIAGNOSIS — L821 Other seborrheic keratosis: Secondary | ICD-10-CM

## 2024-06-03 DIAGNOSIS — Z7189 Other specified counseling: Secondary | ICD-10-CM

## 2024-06-03 DIAGNOSIS — W908XXA Exposure to other nonionizing radiation, initial encounter: Secondary | ICD-10-CM | POA: Diagnosis not present

## 2024-06-03 DIAGNOSIS — Z79899 Other long term (current) drug therapy: Secondary | ICD-10-CM

## 2024-06-03 MED ORDER — HUMIRA (2 PEN) 40 MG/0.4ML ~~LOC~~ AJKT: 40.0000 mg | AUTO-INJECTOR | SUBCUTANEOUS | 6 refills | Status: AC

## 2024-06-03 NOTE — Patient Instructions (Signed)

## 2024-06-03 NOTE — Progress Notes (Unsigned)
   Follow-Up Visit   Subjective  Bruce Chambers. is a 63 y.o. male who presents for the following: Psoriasis (6 month psoriasis follow up. Treating with Humira  injections, patient report he ran out of Humira  ~ 2 months ago.  Still has active areas on his forehead  The following portions of the chart were reviewed this encounter and updated as appropriate: medications, allergies, medical history  Review of Systems:  No other skin or systemic complaints except as noted in HPI or Assessment and Plan.  Objective  Well appearing patient in no apparent distress; mood and affect are within normal limits.  Areas Examined: Face,chest,back,scalp   Relevant exam findings are noted in the Assessment and Plan.   Assessment & Plan   SEBORRHEIC KERATOSIS - Stuck-on, waxy, tan-brown papules and/or plaques  - Benign-appearing - Discussed benign etiology and prognosis. - Observe - Call for any changes  ACTINIC DAMAGE - chronic, secondary to cumulative UV radiation exposure/sun exposure over time - diffuse scaly erythematous macules with underlying dyspigmentation - Recommend daily broad spectrum sunscreen SPF 30+ to sun-exposed areas, reapply every 2 hours as needed.  - Recommend staying in the shade or wearing long sleeves, sun glasses (UVA+UVB protection) and wide brim hats (4-inch brim around the entire circumference of the hat). - Call for new or changing lesions.    PSORIASIS on Systemic Treatment Exam: Clear 0% BSA. Chronic condition with duration or expected duration over one year. Currently well-controlled. patient denies joint pain Labs from 12/08/2023 reviewed-Quantiferon TB Gold  Normal  Psoriasis - severe on systemic treatment.  Psoriasis is a chronic non-curable, but treatable genetic/hereditary disease that may have other systemic features affecting other organ systems such as joints (Psoriatic Arthritis).  It is linked with heart disease, inflammatory bowel disease,  non-alcoholic fatty liver disease, and depression. Significant skin psoriasis and/or psoriatic arthritis may have significant symptoms and affects activities of daily activity and often benefits from systemic treatments.  These systemic treatments have some potential side effects including immunosuppression and require pre-treatment laboratory screening and periodic laboratory monitoring and periodic in person evaluation and monitoring by the attending dermatologist physician (long term medication management).  Treatment Plan: Restart Humira   40 mg/0.4 ml injections every 2 weeks.   Reviewed risks of biologics including immunosuppression, infections, injection site reaction, and failure to improve condition. Goal is control of skin condition, not cure.  Some older biologics such as Humira  and Enbrel may slightly increase risk of malignancy and may worsen congestive heart failure.  Taltz and Cosentyx may cause inflammatory bowel disease to flare. The use of biologics requires long term medication management, including periodic office visits and monitoring of blood work.  Long term medication management.  Patient is using long term (months to years) prescription medication  to control their dermatologic condition.  These medications require periodic monitoring to evaluate for efficacy and side effects and may require periodic laboratory monitoring.    Recheck forehead at next office visit - AK vs psoriasis    Return in about 6 months (around 12/01/2024) for Psoriasis .  IFay Kirks, CMA, am acting as scribe for Alm Rhyme, MD .   Documentation: I have reviewed the above documentation for accuracy and completeness, and I agree with the above.  Alm Rhyme, MD

## 2024-06-04 ENCOUNTER — Encounter: Payer: Self-pay | Admitting: Dermatology

## 2024-06-12 ENCOUNTER — Other Ambulatory Visit: Payer: Self-pay

## 2024-06-12 DIAGNOSIS — M1711 Unilateral primary osteoarthritis, right knee: Secondary | ICD-10-CM

## 2024-06-12 DIAGNOSIS — G8929 Other chronic pain: Secondary | ICD-10-CM

## 2024-06-12 DIAGNOSIS — M1712 Unilateral primary osteoarthritis, left knee: Secondary | ICD-10-CM

## 2024-06-24 NOTE — Telephone Encounter (Signed)
Spoke with patient today and scheduled surgery. 

## 2024-07-02 NOTE — Progress Notes (Signed)
 Sent message, via epic in basket, requesting orders in epic from [facility].

## 2024-07-05 NOTE — Progress Notes (Signed)
 Second request for pre op orders: Left voicemail for [facility].

## 2024-07-07 NOTE — Patient Instructions (Addendum)
 SURGICAL WAITING ROOM VISITATION Patients having surgery or a procedure may have no more than 2 support people in the waiting area - these visitors may rotate in the visitor waiting room.   If the patient needs to stay at the hospital during part of their recovery, the visitor guidelines for inpatient rooms apply.  PRE-OP VISITATION  Pre-op nurse will coordinate an appropriate time for 1 support person to accompany the patient in pre-op.  This support person may not rotate.  This visitor will be contacted when the time is appropriate for the visitor to come back in the pre-op area.  Please refer to the [facility] website for the visitor guidelines for Inpatients (after your surgery is over and you are in a regular room).  Temporary Visitor Restrictions  Children ages 45 and under will not be able to visit patients in [facility] under most circumstances. Visitation is not restricted outside of hospitals unless noted otherwise in the [facility] and Location Specific Visitation Guidelines at :      [email or web address] Visitors with respiratory illnesses are discouraged from visiting and should remain at home.  You are not required to quarantine at this time prior to your surgery. However, you must do this: Hand Hygiene often Do NOT share personal items Notify your provider if you are in close contact with someone who has COVID or you develop fever 100.4 or greater, new onset of sneezing, cough, sore throat, shortness of breath or body aches.  If you test positive for Covid or have been in contact with anyone that has tested positive in the last 10 days please notify you surgeon.    Your procedure is scheduled on:  Friday  07-19-2024  Report to [facility] Main Entrance: [address] entrance where the [employer] is available.   Report to admitting at: 07:30 AM  Call this number if you have any questions or problems the morning of surgery [phone]  Do not eat food  after Midnight the night prior to your surgery/procedure.  After Midnight you may have the following liquids until  07:00 AM DAY OF SURGERY  Clear Liquid Diet Water Black Coffee (sugar ok, NO MILK/CREAM OR CREAMERS)  Tea (sugar ok, NO MILK/CREAM OR CREAMERS) regular and decaf                             Plain Jell-O  with no fruit (NO RED)                                           Fruit ices (not with fruit pulp, NO RED)                                     Popsicles (NO RED)                                                                  Juice: NO CITRUS JUICES: only apple, WHITE grape, WHITE cranberry Sports drinks like Gatorade or Powerade (NO RED)  FOLLOW ANY ADDITIONAL PRE OP INSTRUCTIONS YOU RECEIVED FROM YOUR SURGEON'S OFFICE!!!   Oral Hygiene is also important to reduce your risk of infection.        Remember - BRUSH YOUR TEETH THE MORNING OF SURGERY WITH YOUR REGULAR TOOTHPASTE  Do NOT smoke after Midnight the night before surgery.  STOP TAKING all Vitamins, Herbs and supplements 1 week before your surgery.   Take ONLY these medicines the morning of surgery with A SIP OF WATER: Metoprolol , Bupropion , Lamotrigine ,   DO NOT TAKE  Valsartan the morning of your surgery.   Stop REPATHA and HUMIRA  injections according to your Doctor's instructions.  You may not have any metal on your body including  jewelry, and body piercing  Do not wear lotions, powders, cologne, or deodorant  Men may shave face and neck.  Contacts, Hearing Aids, dentures or bridgework may not be worn into surgery. DENTURES WILL BE REMOVED PRIOR TO SURGERY PLEASE DO NOT APPLY Poly grip OR ADHESIVES!!!  You may bring a small overnight bag with you on the day of surgery, only pack items that are not valuable. [facility] IS NOT RESPONSIBLE   FOR VALUABLES THAT ARE LOST OR STOLEN.   Do not bring your home medications to the hospital. The Pharmacy will dispense medications listed on your  medication list to you during your admission in the Hospital.  Please read over the following fact sheets you were given: IF YOU HAVE QUESTIONS ABOUT YOUR PRE-OP INSTRUCTIONS, PLEASE CALL [phone].      Pre-operative 4 CHG Bath Instructions   You can play a key role in reducing the risk of infection after surgery. Your skin needs to be as free of germs as possible. You can reduce the number of germs on your skin by washing with CHG (chlorhexidine gluconate) soap before surgery. CHG is an antiseptic soap that kills germs and continues to kill germs even after washing.   DO NOT use if you have an allergy to chlorhexidine/CHG or antibacterial soaps. If your skin becomes reddened or irritated, stop using the CHG and notify one of our RNs at [phone]  Please shower with the CHG soap starting 4 days before surgery using the following schedule: Monday 07-05-2024     Do NOT use CHG soap                                                                                                                      the morning of your  surgery.         Please keep in mind the following:  DO NOT shave, including legs and underarms, starting the day of your first shower.   You may shave your face at any point before/day of surgery.  Place clean sheets on your bed the day you start using CHG soap. Use a clean washcloth (not used since being washed) for each shower. DO NOT sleep with pets once you start using the CHG.  CHG Shower Instructions:  If you choose to wash your hair and private area, wash first with your normal shampoo/soap.  After you use shampoo/soap, rinse your hair and body thoroughly to remove shampoo/soap residue.  Turn the water OFF and apply about 3 tablespoons (45 ml) of CHG soap to a CLEAN washcloth.  Apply CHG soap ONLY FROM YOUR NECK DOWN TO YOUR TOES  (washing for 3-5 minutes)  DO NOT use CHG soap on face, private areas, open wounds, or sores.  Pay special attention to the area where your surgery is being performed.  If you are having back surgery, having someone wash your back for you may be helpful. Wait 2 minutes after CHG soap is applied, then you may rinse off the CHG soap.  Pat dry with a clean towel  Put on clean clothes/pajamas   If you choose to wear lotion, please use ONLY the CHG-compatible lotions on the back of this paper.     Additional instructions for the day of surgery: DO NOT APPLY any CHG Soap,  lotions, deodorants, cologne, or perfumes on the day of surgery  Put on clean/comfortable clothes.  Brush your teeth.  Ask your nurse before applying any prescription medications to the skin.   CHG Compatible Lotions   Aveeno Moisturizing lotion  Cetaphil Moisturizing Cream  Cetaphil Moisturizing Lotion  Clairol Herbal Essence Moisturizing Lotion, Dry Skin  Clairol Herbal Essence Moisturizing Lotion, Extra Dry Skin  Clairol Herbal Essence Moisturizing Lotion, Normal Skin  Curel Age Defying Therapeutic Moisturizing Lotion with Alpha Hydroxy  Curel Extreme Care Body Lotion  Curel Soothing Hands Moisturizing Hand Lotion  Curel Therapeutic Moisturizing Cream, Fragrance-Free  Curel Therapeutic Moisturizing Lotion, Fragrance-Free  Curel Therapeutic Moisturizing Lotion, Original Formula  Eucerin Daily Replenishing Lotion  Eucerin Dry Skin Therapy Plus Alpha Hydroxy Crme  Eucerin Dry Skin Therapy Plus Alpha Hydroxy Lotion  Eucerin Original Crme  Eucerin Original Lotion  Eucerin Plus Crme Eucerin Plus Lotion  Eucerin TriLipid Replenishing Lotion  Keri Anti-Bacterial Hand Lotion  Keri Deep Conditioning Original Lotion Dry Skin Formula Softly Scented  Keri Deep Conditioning Original Lotion, Fragrance Free Sensitive Skin Formula  Keri Lotion Fast Absorbing Fragrance Free Sensitive Skin Formula  Keri Lotion Fast Absorbing  Softly Scented Dry Skin Formula  Keri Original Lotion  Keri Skin Renewal Lotion Keri Silky Smooth Lotion  Keri Silky Smooth Sensitive Skin Lotion  Nivea Body Creamy Conditioning Oil  Nivea Body Extra Enriched Lotion  Nivea Body Original Lotion  Nivea Body Sheer Moisturizing Lotion Nivea Crme  Nivea Skin Firming Lotion  NutraDerm 30 Skin Lotion  NutraDerm Skin Lotion  NutraDerm Therapeutic Skin Cream  NutraDerm Therapeutic Skin Lotion  ProShield Protective Hand Cream  Provon moisturizing lotion   FAILURE TO FOLLOW THESE INSTRUCTIONS MAY RESULT IN THE CANCELLATION OF YOUR SURGERY  PATIENT SIGNATURE_________________________________  NURSE SIGNATURE__________________________________  ________________________________________________________________________        Bruce Chambers    An incentive spirometer is a tool that can help keep your lungs clear and active. This tool measures  how well you are filling your lungs with each breath. Taking long deep breaths may help reverse or decrease the chance of developing breathing (pulmonary) problems (especially infection) following: A long period of time when you are unable to move or be active. BEFORE THE PROCEDURE  If the spirometer includes an indicator to show your best effort, your nurse or respiratory therapist will set it to a desired goal. If possible, sit up straight or lean slightly forward. Try not to slouch. Hold the incentive spirometer in an upright position. INSTRUCTIONS FOR USE  Sit on the edge of your bed if possible, or sit up as far as you can in bed or on a chair. Hold the incentive spirometer in an upright position. Breathe out normally. Place the mouthpiece in your mouth and seal your lips tightly around it. Breathe in slowly and as deeply as possible, raising the piston or the ball toward the top of the column. Hold your breath for 3-5 seconds or for as long as possible. Allow the piston or ball to fall  to the bottom of the column. Remove the mouthpiece from your mouth and breathe out normally. Rest for a few seconds and repeat Steps 1 through 7 at least 10 times every 1-2 hours when you are awake. Take your time and take a few normal breaths between deep breaths. The spirometer may include an indicator to show your best effort. Use the indicator as a goal to work toward during each repetition. After each set of 10 deep breaths, practice coughing to be sure your lungs are clear. If you have an incision (the cut made at the time of surgery), support your incision when coughing by placing a pillow or rolled up towels firmly against it. Once you are able to get out of bed, walk around indoors and cough well. You may stop using the incentive spirometer when instructed by your caregiver.  RISKS AND COMPLICATIONS Take your time so you do not get dizzy or light-headed. If you are in pain, you may need to take or ask for pain medication before doing incentive spirometry. It is harder to take a deep breath if you are having pain. AFTER USE Rest and breathe slowly and easily. It can be helpful to keep track of a log of your progress. Your caregiver can provide you with a simple table to help with this. If you are using the spirometer at home, follow these instructions: SEEK MEDICAL CARE IF:  You are having difficultly using the spirometer. You have trouble using the spirometer as often as instructed. Your pain medication is not giving enough relief while using the spirometer. You develop fever of 100.5 F (38.1 C) or higher.                                                                                                    SEEK IMMEDIATE MEDICAL CARE IF:  You cough up bloody sputum that had not been present before. You develop fever of 102 F (38.9 C) or greater. You develop worsening pain at or near the incision site. MAKE  SURE YOU:  Understand these instructions. Will watch your condition. Will  get help right away if you are not doing well or get worse. Document Released: 11/14/2006 Document Revised: 09/26/2011 Document Reviewed: 01/15/2007 [facility] [employer] Patient Information 2014 [place], [place].       If you would like to see a video about joint replacement:   [email or web address]

## 2024-07-07 NOTE — Progress Notes (Signed)
 COVID Vaccine received:  []  No [x]  Yes Date of any COVID positive Test in last 90 days: none  PCP - Alda Carpen, MD [phone] (Work)  [phone] (Fax)  Cardiologist - Ozell Barrier, MD at [facility]  cardiac clearance in 06-20-24 note  [phone] (Work)  [phone] (Fax)  EP-  Mylinda Bullock, NP at [facility] EP  Chest x-ray - 07-17-2023  2v in [facility] EKG - 03-25-2024  Epic  Stress Test -  ECHO - 04-09-2024  in [facility] Cardiac Cath - 11-09-2023  DES x 1  CT Coronary Calcium score:  Attempted Ablation - 02-07-24  note in [facility]  History of Sleep Apnea? [x]  No []  Yes   CPAP used?- [x]  No []  Yes    Medication on DOS: Metoprolol , Bupropion , Lamotrigine ,   Hold DOS: Valsartan,   Stop REPATHA and HUMIRA  injections  Patient has: [x]  NO Hx DM   []  Pre-DM   []  DM1  []   DM2 Does the patient monitor blood sugar?   [x]  N/A   []  No []  Yes  Last A1c was: 5.2 normal  on  03-25-24     Blood Thinner / Instructions: Plavix    Hold x 7 days per Dr. Krista 06-20-24 note Aspirin  Instructions:  ASA 81 mg  continue   Activity level: Able to walk up 2 flights of stairs without becoming significantly short of breath or having chest pain?   [x]    Yes   []  No,  would have:  Patient can perform ADLs without assistance.  [x]   Yes    []  No   Anesthesia review: CAD (DES x1 11-09-23), PVD- Claudication  HTN, PVCs, NICM,  Psoriasis, Bipolar II, depression/ anxiety- recent suicidal ideation / plan (seen at [facility] in September and October )   Patient denies any S&S of respiratory illness or Covid - no shortness of breath, fever, cough or chest pain at PAT appointment.  Patient verbalized understanding and agreement to the Pre-Surgical Instructions that were given to them at this PAT appointment. Patient was also educated of the need to review these PAT instructions again prior to his/her surgery.I reviewed the appropriate phone numbers to call if they have any and questions or concerns.

## 2024-07-09 ENCOUNTER — Encounter (HOSPITAL_COMMUNITY): Payer: Self-pay

## 2024-07-09 ENCOUNTER — Encounter (HOSPITAL_COMMUNITY)
Admission: RE | Admit: 2024-07-09 | Discharge: 2024-07-09 | Disposition: A | Discharge status: Home / Self Care | Source: Ambulatory Visit | Attending: Orthopaedic Surgery | Admitting: Orthopaedic Surgery

## 2024-07-09 ENCOUNTER — Other Ambulatory Visit: Payer: Self-pay

## 2024-07-09 VITALS — BP 121/72 | HR 78 | Temp 98.8°F | Resp 16 | Ht 71.5 in | Wt 197.0 lb

## 2024-07-09 DIAGNOSIS — M1711 Unilateral primary osteoarthritis, right knee: Secondary | ICD-10-CM | POA: Diagnosis not present

## 2024-07-09 DIAGNOSIS — I428 Other cardiomyopathies: Secondary | ICD-10-CM

## 2024-07-09 DIAGNOSIS — Z01812 Encounter for preprocedural laboratory examination: Secondary | ICD-10-CM | POA: Diagnosis present

## 2024-07-09 DIAGNOSIS — R931 Abnormal findings on diagnostic imaging of heart and coronary circulation: Secondary | ICD-10-CM

## 2024-07-09 DIAGNOSIS — Z79620 Long term (current) use of immunosuppressive biologic: Secondary | ICD-10-CM | POA: Insufficient documentation

## 2024-07-09 DIAGNOSIS — Z79899 Other long term (current) drug therapy: Secondary | ICD-10-CM | POA: Diagnosis not present

## 2024-07-09 DIAGNOSIS — I2510 Atherosclerotic heart disease of native coronary artery without angina pectoris: Secondary | ICD-10-CM

## 2024-07-09 DIAGNOSIS — Z01818 Encounter for other preprocedural examination: Secondary | ICD-10-CM

## 2024-07-09 HISTORY — DX: Myoneural disorder, unspecified: G70.9

## 2024-07-09 HISTORY — DX: Cardiac arrhythmia, unspecified: I49.9

## 2024-07-09 HISTORY — DX: Unspecified osteoarthritis, unspecified site: M19.90

## 2024-07-09 HISTORY — DX: Family history of other specified conditions: Z84.89

## 2024-07-09 HISTORY — DX: Peripheral vascular disease, unspecified: I73.9

## 2024-07-09 LAB — CBC
HCT: 47.1 % (ref 39.0–52.0)
Hemoglobin: 16.1 g/dL (ref 13.0–17.0)
MCH: 32 pg (ref 26.0–34.0)
MCHC: 34.2 g/dL (ref 30.0–36.0)
MCV: 93.6 fL (ref 80.0–100.0)
Platelets: 312 K/uL (ref 150–400)
RBC: 5.03 MIL/uL (ref 4.22–5.81)
RDW: 13 % (ref 11.5–15.5)
WBC: 10.5 K/uL (ref 4.0–10.5)
nRBC: 0 % (ref 0.0–0.2)

## 2024-07-09 LAB — COMPREHENSIVE METABOLIC PANEL WITH GFR
ALT: 15 U/L (ref 0–44)
AST: 20 U/L (ref 15–41)
Albumin: 4.2 g/dL (ref 3.5–5.0)
Alkaline Phosphatase: 66 U/L (ref 38–126)
Anion gap: 9 (ref 5–15)
BUN: 19 mg/dL (ref 8–23)
CO2: 28 mmol/L (ref 22–32)
Calcium: 9.8 mg/dL (ref 8.9–10.3)
Chloride: 100 mmol/L (ref 98–111)
Creatinine, Ser: 1.05 mg/dL (ref 0.61–1.24)
GFR, Estimated: >60 mL/min
Glucose, Bld: 97 mg/dL (ref 70–99)
Potassium: 4.4 mmol/L (ref 3.5–5.1)
Sodium: 137 mmol/L (ref 135–145)
Total Bilirubin: 0.4 mg/dL (ref 0.0–1.2)
Total Protein: 7.2 g/dL (ref 6.5–8.1)

## 2024-07-09 LAB — SURGICAL PCR SCREEN
MRSA, PCR: NEGATIVE
Staphylococcus aureus: NEGATIVE

## 2024-07-18 NOTE — H&P (Signed)
 TOTAL KNEE ADMISSION H&P  Patient is being admitted for right total knee arthroplasty.  Subjective:  Chief Complaint:right knee pain.  HPI: Bruce W Larner Jr., 64 y.o. male, has a history of pain and functional disability in the right knee due to arthritis and has failed non-surgical conservative treatments for greater than 12 weeks to includecorticosteriod injections, flexibility and strengthening excercises, and activity modification.  Onset of symptoms was gradual, starting a few years ago with gradually worsening course since that time. The patient noted no past surgery on the right knee(s).  Patient currently rates pain in the right knee(s) at 10 out of 10 with activity. Patient has night pain, worsening of pain with activity and weight bearing, pain that interferes with activities of daily living, pain with passive range of motion, crepitus, and joint swelling.  Patient has evidence of subchondral sclerosis, periarticular osteophytes, and joint space narrowing by imaging studies. There is no active infection.  Patient Active Problem List   Diagnosis Date Noted   Bipolar II disorder (HCC) 03/25/2024   Bipolar affect, depressed (HCC) 03/24/2024   Unilateral primary osteoarthritis, right knee 01/15/2024   Unilateral primary osteoarthritis, left knee 01/15/2024   Pure hypercholesterolemia 11/06/2023   Idiopathic peripheral neuropathy 11/06/2023   Coronary artery disease of native artery of native heart with stable angina pectoris 09/27/2022   Spinal stenosis of lumbar region with neurogenic claudication 09/07/2022   Psoriasis 09/07/2022   Acid reflux 03/06/2018   BP (high blood pressure) 03/06/2018   Brash 03/06/2018   Dermatitis, eczematoid 03/06/2018   HLD (hyperlipidemia) 03/06/2018   Acute lower GI bleeding 02/05/2015   Leg pain 07/02/2014   Low back pain with sciatica 07/02/2014   Numbness and tingling 07/02/2014   Past Medical History:  Diagnosis Date   Arthritis    Bipolar  1 disorder (HCC)    Dysplastic nevus 11/29/2023   Right infrascapular, moderate atypia   Dysrhythmia    PVCs  has had ablations   Family history of adverse reaction to anesthesia    Father hallucinations,   GERD (gastroesophageal reflux disease)    Hypertension    Neuromuscular disorder (HCC)    neuropathy BLE, sciatica   Peripheral vascular disease    claudication   Psoriasis    PVC (premature ventricular contraction)     Past Surgical History:  Procedure Laterality Date   BACK SURGERY  2018   lumbar diskectomy foramenotomy? By Dr. Malcolm   JOINT REPLACEMENT Left 2022   THA by Dr. Josefina   NASAL SEPTUM SURGERY  2006   for deviated septum   WISDOM TOOTH EXTRACTION      No current facility-administered medications for this encounter.   Current Outpatient Medications  Medication Sig Dispense Refill Last Dose/Taking   adalimumab  (HUMIRA , 2 PEN,) 40 MG/0.4ML pen Inject 0.4 mLs (40 mg total) into the skin every 14 (fourteen) days. Inject 0.4 mL (40 mg) every 14 days. 2 each 6 Taking   aspirin  EC 81 MG tablet Take 81 mg by mouth in the morning.   Taking   buPROPion  (WELLBUTRIN  XL) 300 MG 24 hr tablet Take 1 tablet (300 mg total) by mouth daily. 30 tablet 0 Taking   clopidogrel  (PLAVIX ) 75 MG tablet Take 1 tablet (75 mg total) by mouth daily. 30 tablet 0 Taking   lamoTRIgine  (LAMICTAL ) 200 MG tablet Take 200 mg by mouth in the morning.   Taking   metoprolol  succinate (TOPROL -XL) 25 MG 24 hr tablet Take 25 mg by mouth in the  morning.   Taking   REPATHA SURECLICK 140 MG/ML SOAJ Inject 140 mg into the skin every 14 (fourteen) days.   Taking   valsartan (DIOVAN) 40 MG tablet Take 80 mg by mouth in the morning.   Taking   Allergies[1]  Social History   Tobacco Use   Smoking status: Never   Smokeless tobacco: Never  Substance Use Topics   Alcohol use: Not Currently    Alcohol/week: 2.0 standard drinks of alcohol    Types: 2 Glasses of wine per week    Comment: Not in the past 2  weeks, prior to that 2 glasses of wine a day.    Family History  Problem Relation Age of Onset   Emphysema Mother    Atrial fibrillation Father    Atrial fibrillation Sister      Review of Systems  Objective:  Physical Exam Vitals reviewed.  Constitutional:      Appearance: Normal appearance. He is normal weight.  HENT:     Head: Normocephalic and atraumatic.  Eyes:     Extraocular Movements: Extraocular movements intact.     Pupils: Pupils are equal, round, and reactive to light.  Cardiovascular:     Rate and Rhythm: Normal rate and regular rhythm.  Pulmonary:     Effort: Pulmonary effort is normal.     Breath sounds: Normal breath sounds.  Abdominal:     Palpations: Abdomen is soft.  Musculoskeletal:     Cervical back: Normal range of motion and neck supple.     Right knee: Effusion, bony tenderness and crepitus present. Decreased range of motion. Tenderness present over the medial joint line and lateral joint line. Abnormal alignment and abnormal meniscus.  Neurological:     Mental Status: He is alert and oriented to person, place, and time.  Psychiatric:        Behavior: Behavior normal.     Vital signs in last 24 hours:    Labs:   Estimated body mass index is 27.09 kg/m as calculated from the following:   Height as of 07/09/24: 5' 11.5 (1.816 m).   Weight as of 07/09/24: 89.4 kg.   Imaging Review Plain radiographs demonstrate severe degenerative joint disease of the right knee(s). The overall alignment ismild varus. The bone quality appears to be excellent for age and reported activity level.      Assessment/Plan:  End stage arthritis, right knee   The patient history, physical examination, clinical judgment of the provider and imaging studies are consistent with end stage degenerative joint disease of the right knee(s) and total knee arthroplasty is deemed medically necessary. The treatment options including medical management, injection therapy  arthroscopy and arthroplasty were discussed at length. The risks and benefits of total knee arthroplasty were presented and reviewed. The risks due to aseptic loosening, infection, stiffness, patella tracking problems, thromboembolic complications and other imponderables were discussed. The patient acknowledged the explanation, agreed to proceed with the plan and consent was signed. Patient is being admitted for inpatient treatment for surgery, pain control, PT, OT, prophylactic antibiotics, VTE prophylaxis, progressive ambulation and ADL's and discharge planning. The patient is planning to be discharged home with home health services        [1]  Allergies Allergen Reactions   Gabapentin Other (See Comments)    Changed mood and did not like the way he felt on it.     Rosuvastatin Other (See Comments)    Muscle/joint pain   Other Rash    Allergic  to [place]

## 2024-07-19 ENCOUNTER — Other Ambulatory Visit: Payer: Self-pay

## 2024-07-19 ENCOUNTER — Ambulatory Visit (HOSPITAL_COMMUNITY): Admitting: Registered Nurse

## 2024-07-19 ENCOUNTER — Observation Stay (HOSPITAL_COMMUNITY)
Admission: RE | Admit: 2024-07-19 | Discharge: 2024-07-20 | Disposition: A | Discharge status: Home / Self Care | Source: Home / Self Care | Attending: Orthopaedic Surgery | Admitting: Orthopaedic Surgery

## 2024-07-19 ENCOUNTER — Encounter (HOSPITAL_COMMUNITY): Payer: Self-pay | Admitting: Orthopaedic Surgery

## 2024-07-19 ENCOUNTER — Encounter (HOSPITAL_COMMUNITY)
Admission: RE | Disposition: A | Discharge status: Home / Self Care | Payer: Self-pay | Source: Home / Self Care | Attending: Orthopaedic Surgery

## 2024-07-19 ENCOUNTER — Observation Stay (HOSPITAL_COMMUNITY)

## 2024-07-19 DIAGNOSIS — I10 Essential (primary) hypertension: Secondary | ICD-10-CM | POA: Diagnosis not present

## 2024-07-19 DIAGNOSIS — M1711 Unilateral primary osteoarthritis, right knee: Secondary | ICD-10-CM

## 2024-07-19 DIAGNOSIS — Z96642 Presence of left artificial hip joint: Secondary | ICD-10-CM | POA: Insufficient documentation

## 2024-07-19 DIAGNOSIS — I25118 Atherosclerotic heart disease of native coronary artery with other forms of angina pectoris: Secondary | ICD-10-CM

## 2024-07-19 DIAGNOSIS — Z96651 Presence of right artificial knee joint: Secondary | ICD-10-CM

## 2024-07-19 DIAGNOSIS — I25119 Atherosclerotic heart disease of native coronary artery with unspecified angina pectoris: Secondary | ICD-10-CM | POA: Diagnosis not present

## 2024-07-19 DIAGNOSIS — Z7982 Long term (current) use of aspirin: Secondary | ICD-10-CM | POA: Insufficient documentation

## 2024-07-19 DIAGNOSIS — E785 Hyperlipidemia, unspecified: Secondary | ICD-10-CM | POA: Diagnosis not present

## 2024-07-19 HISTORY — PX: TOTAL KNEE ARTHROPLASTY: SHX125

## 2024-07-19 SURGERY — ARTHROPLASTY, KNEE, TOTAL
Anesthesia: Spinal | Site: Knee | Laterality: Right

## 2024-07-19 MED ORDER — MIDAZOLAM HCL (PF) 2 MG/2ML IJ SOLN
2.0000 mg | Freq: Once | INTRAMUSCULAR | Status: AC
  Administered 2024-07-19: 1 mg via INTRAVENOUS
  Filled 2024-07-19: qty 2

## 2024-07-19 MED ORDER — METOCLOPRAMIDE HCL 5 MG/ML IJ SOLN: 5.0000 mg | Freq: Three times a day (TID) | INTRAMUSCULAR | Status: DC | PRN

## 2024-07-19 MED ORDER — ONDANSETRON HCL 4 MG/2ML IJ SOLN: 4.0000 mg | Freq: Once | INTRAMUSCULAR | Status: DC | PRN

## 2024-07-19 MED ORDER — PHENOL 1.4 % MT LIQD
1.0000 | OROMUCOSAL | Status: DC | PRN
  Administered 2024-07-20 (×2): 1 via OROMUCOSAL
  Filled 2024-07-19: qty 177

## 2024-07-19 MED ORDER — ORAL CARE MOUTH RINSE: 15.0000 mL | OROMUCOSAL | Status: DC | PRN

## 2024-07-19 MED ORDER — CHLORHEXIDINE GLUCONATE 0.12 % MT SOLN
15.0000 mL | Freq: Once | OROMUCOSAL | Status: AC
  Administered 2024-07-19: 15 mL via OROMUCOSAL

## 2024-07-19 MED ORDER — METOPROLOL SUCCINATE ER 25 MG PO TB24
25.0000 mg | ORAL_TABLET | Freq: Every day | ORAL | Status: DC
  Administered 2024-07-20: 25 mg via ORAL
  Filled 2024-07-19: qty 1

## 2024-07-19 MED ORDER — SODIUM CHLORIDE 0.9 % IR SOLN
Status: DC | PRN
  Administered 2024-07-19: 1000 mL

## 2024-07-19 MED ORDER — FENTANYL CITRATE (PF) 50 MCG/ML IJ SOSY
50.0000 ug | PREFILLED_SYRINGE | Freq: Once | INTRAMUSCULAR | Status: AC
  Administered 2024-07-19: 50 ug via INTRAVENOUS
  Filled 2024-07-19: qty 1

## 2024-07-19 MED ORDER — ACETAMINOPHEN 10 MG/ML IV SOLN
INTRAVENOUS | Status: AC
  Filled 2024-07-19: qty 100

## 2024-07-19 MED ORDER — FENTANYL CITRATE (PF) 50 MCG/ML IJ SOSY: 25.0000 ug | PREFILLED_SYRINGE | INTRAMUSCULAR | Status: DC | PRN

## 2024-07-19 MED ORDER — ASPIRIN 81 MG PO TBEC
81.0000 mg | DELAYED_RELEASE_TABLET | Freq: Every day | ORAL | Status: DC
  Administered 2024-07-20: 81 mg via ORAL
  Filled 2024-07-19: qty 1

## 2024-07-19 MED ORDER — PROPOFOL 1000 MG/100ML IV EMUL
INTRAVENOUS | Status: AC
  Filled 2024-07-19: qty 100

## 2024-07-19 MED ORDER — ONDANSETRON HCL 4 MG/2ML IJ SOLN: 4.0000 mg | Freq: Four times a day (QID) | INTRAMUSCULAR | Status: DC | PRN

## 2024-07-19 MED ORDER — ONDANSETRON HCL 4 MG PO TABS: 4.0000 mg | ORAL_TABLET | Freq: Four times a day (QID) | ORAL | Status: DC | PRN

## 2024-07-19 MED ORDER — ONDANSETRON HCL 4 MG/2ML IJ SOLN
INTRAMUSCULAR | Status: DC | PRN
  Administered 2024-07-19: 4 mg via INTRAVENOUS

## 2024-07-19 MED ORDER — ONDANSETRON HCL 4 MG/2ML IJ SOLN
INTRAMUSCULAR | Status: AC
  Filled 2024-07-19: qty 2

## 2024-07-19 MED ORDER — ALUM & MAG HYDROXIDE-SIMETH 200-200-20 MG/5ML PO SUSP: 30.0000 mL | ORAL | Status: DC | PRN

## 2024-07-19 MED ORDER — BUPROPION HCL ER (XL) 300 MG PO TB24
300.0000 mg | ORAL_TABLET | Freq: Every day | ORAL | Status: DC
  Administered 2024-07-20: 300 mg via ORAL
  Filled 2024-07-19: qty 1

## 2024-07-19 MED ORDER — DROPERIDOL 2.5 MG/ML IJ SOLN: 0.6250 mg | Freq: Once | INTRAMUSCULAR | Status: DC | PRN

## 2024-07-19 MED ORDER — ROPIVACAINE HCL 5 MG/ML IJ SOLN
INTRAMUSCULAR | Status: DC | PRN
  Administered 2024-07-19: 30 mL via PERINEURAL

## 2024-07-19 MED ORDER — LACTATED RINGERS IV SOLN: INTRAVENOUS | Status: DC

## 2024-07-19 MED ORDER — OXYCODONE HCL 5 MG PO TABS: 10.0000 mg | ORAL_TABLET | ORAL | Status: DC | PRN

## 2024-07-19 MED ORDER — POVIDONE-IODINE 10 % EX SWAB: 2.0000 | Freq: Once | CUTANEOUS | Status: DC

## 2024-07-19 MED ORDER — TRANEXAMIC ACID-NACL 1000-0.7 MG/100ML-% IV SOLN
1000.0000 mg | INTRAVENOUS | Status: AC
  Administered 2024-07-19: 1000 mg via INTRAVENOUS
  Filled 2024-07-19: qty 100

## 2024-07-19 MED ORDER — BUPIVACAINE-EPINEPHRINE 0.25% -1:200000 IJ SOLN
INTRAMUSCULAR | Status: DC | PRN
  Administered 2024-07-19: 30 mL

## 2024-07-19 MED ORDER — CLOPIDOGREL BISULFATE 75 MG PO TABS
75.0000 mg | ORAL_TABLET | Freq: Every day | ORAL | Status: DC
  Administered 2024-07-20: 75 mg via ORAL
  Filled 2024-07-19: qty 1

## 2024-07-19 MED ORDER — CEFAZOLIN SODIUM-DEXTROSE 2-4 GM/100ML-% IV SOLN
2.0000 g | Freq: Four times a day (QID) | INTRAVENOUS | Status: AC
  Administered 2024-07-19 (×2): 2 g via INTRAVENOUS
  Filled 2024-07-19 (×2): qty 100

## 2024-07-19 MED ORDER — 0.9 % SODIUM CHLORIDE (POUR BTL) OPTIME
TOPICAL | Status: DC | PRN
  Administered 2024-07-19: 1000 mL

## 2024-07-19 MED ORDER — IRBESARTAN 75 MG PO TABS
75.0000 mg | ORAL_TABLET | Freq: Every day | ORAL | Status: DC
  Administered 2024-07-20: 75 mg via ORAL
  Filled 2024-07-19: qty 1

## 2024-07-19 MED ORDER — ACETAMINOPHEN 325 MG PO TABS: 325.0000 mg | ORAL_TABLET | Freq: Four times a day (QID) | ORAL | Status: DC | PRN

## 2024-07-19 MED ORDER — ORAL CARE MOUTH RINSE: 15.0000 mL | Freq: Once | OROMUCOSAL | Status: AC

## 2024-07-19 MED ORDER — BUPIVACAINE IN DEXTROSE 0.75-8.25 % IT SOLN
INTRATHECAL | Status: DC | PRN
  Administered 2024-07-19: 2 mL via INTRATHECAL

## 2024-07-19 MED ORDER — HYDROMORPHONE HCL 1 MG/ML IJ SOLN: 0.5000 mg | INTRAMUSCULAR | Status: DC | PRN

## 2024-07-19 MED ORDER — CEFAZOLIN SODIUM-DEXTROSE 2-4 GM/100ML-% IV SOLN
2.0000 g | INTRAVENOUS | Status: AC
  Administered 2024-07-19: 2 g via INTRAVENOUS
  Filled 2024-07-19: qty 100

## 2024-07-19 MED ORDER — STERILE WATER FOR IRRIGATION IR SOLN
Status: DC | PRN
  Administered 2024-07-19: 2000 mL

## 2024-07-19 MED ORDER — SODIUM CHLORIDE 0.9 % IV SOLN: INTRAVENOUS | Status: DC

## 2024-07-19 MED ORDER — TIZANIDINE HCL 4 MG PO TABS
4.0000 mg | ORAL_TABLET | Freq: Four times a day (QID) | ORAL | Status: DC | PRN
  Administered 2024-07-20 (×2): 4 mg via ORAL
  Filled 2024-07-19 (×2): qty 1

## 2024-07-19 MED ORDER — PANTOPRAZOLE SODIUM 40 MG PO TBEC
40.0000 mg | DELAYED_RELEASE_TABLET | Freq: Every day | ORAL | Status: DC
  Administered 2024-07-19 – 2024-07-20 (×2): 40 mg via ORAL
  Filled 2024-07-19 (×2): qty 1

## 2024-07-19 MED ORDER — PROPOFOL 500 MG/50ML IV EMUL
INTRAVENOUS | Status: DC | PRN
  Administered 2024-07-19: 40 mg via INTRAVENOUS
  Administered 2024-07-19: 40 ug/kg/min via INTRAVENOUS

## 2024-07-19 MED ORDER — KETOROLAC TROMETHAMINE 15 MG/ML IJ SOLN
7.5000 mg | Freq: Four times a day (QID) | INTRAMUSCULAR | Status: AC
  Administered 2024-07-19 – 2024-07-20 (×3): 7.5 mg via INTRAVENOUS
  Filled 2024-07-19 (×3): qty 1

## 2024-07-19 MED ORDER — ACETAMINOPHEN 10 MG/ML IV SOLN
INTRAVENOUS | Status: DC | PRN
  Administered 2024-07-19: 1000 mg via INTRAVENOUS

## 2024-07-19 MED ORDER — MENTHOL 3 MG MT LOZG: 1.0000 | LOZENGE | OROMUCOSAL | Status: DC | PRN

## 2024-07-19 MED ORDER — DEXAMETHASONE SOD PHOSPHATE PF 10 MG/ML IJ SOLN
INTRAMUSCULAR | Status: DC | PRN
  Administered 2024-07-19: 8 mg via INTRAVENOUS

## 2024-07-19 MED ORDER — METOCLOPRAMIDE HCL 5 MG PO TABS: 5.0000 mg | ORAL_TABLET | Freq: Three times a day (TID) | ORAL | Status: DC | PRN

## 2024-07-19 MED ORDER — DOCUSATE SODIUM 100 MG PO CAPS
100.0000 mg | ORAL_CAPSULE | Freq: Two times a day (BID) | ORAL | Status: DC
  Administered 2024-07-19 – 2024-07-20 (×2): 100 mg via ORAL
  Filled 2024-07-19 (×2): qty 1

## 2024-07-19 MED ORDER — DIPHENHYDRAMINE HCL 12.5 MG/5ML PO ELIX: 12.5000 mg | ORAL_SOLUTION | ORAL | Status: DC | PRN

## 2024-07-19 MED ORDER — BUPIVACAINE-EPINEPHRINE (PF) 0.25% -1:200000 IJ SOLN
INTRAMUSCULAR | Status: AC
  Filled 2024-07-19: qty 30

## 2024-07-19 MED ORDER — LAMOTRIGINE 100 MG PO TABS
200.0000 mg | ORAL_TABLET | Freq: Every day | ORAL | Status: DC
  Administered 2024-07-20: 200 mg via ORAL
  Filled 2024-07-19: qty 2

## 2024-07-19 MED ORDER — OXYCODONE HCL 5 MG PO TABS
5.0000 mg | ORAL_TABLET | ORAL | Status: DC | PRN
  Administered 2024-07-19: 5 mg via ORAL
  Administered 2024-07-19 – 2024-07-20 (×4): 10 mg via ORAL
  Filled 2024-07-19 (×2): qty 1
  Filled 2024-07-19 (×4): qty 2

## 2024-07-19 SURGICAL SUPPLY — 50 items
BAG COUNTER SPONGE SURGICOUNT (BAG) IMPLANT
BAG ZIPLOCK 12X15 (MISCELLANEOUS) ×1 IMPLANT
BENZOIN TINCTURE PRP APPL 2/3 (GAUZE/BANDAGES/DRESSINGS) IMPLANT
BLADE SAG 18X100X1.27 (BLADE) ×1 IMPLANT
BNDG ELASTIC 4INX 5YD STR LF (GAUZE/BANDAGES/DRESSINGS) IMPLANT
BNDG ELASTIC 6INX 5YD STR LF (GAUZE/BANDAGES/DRESSINGS) ×2 IMPLANT
BOWL SMART MIX CTS (DISPOSABLE) IMPLANT
COMPONENT FEM PS KN STD 10 RT (Joint) IMPLANT
COMPONENT PATELLA 3 PEG 35 (Joint) IMPLANT
COOLER ICEMAN CLASSIC (MISCELLANEOUS) ×1 IMPLANT
COVER SURGICAL LIGHT HANDLE (MISCELLANEOUS) ×1 IMPLANT
CUFF TRNQT CYL 34X4.125X (TOURNIQUET CUFF) ×1 IMPLANT
DRAPE U-SHAPE 47X51 STRL (DRAPES) ×1 IMPLANT
DURAPREP 26ML APPLICATOR (WOUND CARE) ×1 IMPLANT
ELECT BLADE TIP CTD 4 INCH (ELECTRODE) ×1 IMPLANT
ELECT PENCIL ROCKER SW 15FT (MISCELLANEOUS) ×1 IMPLANT
ELECT REM PT RETURN 15FT ADLT (MISCELLANEOUS) ×1 IMPLANT
GAUZE PAD ABD 8X10 STRL (GAUZE/BANDAGES/DRESSINGS) ×2 IMPLANT
GAUZE SPONGE 4X4 12PLY STRL (GAUZE/BANDAGES/DRESSINGS) ×1 IMPLANT
GAUZE XEROFORM 1X8 LF (GAUZE/BANDAGES/DRESSINGS) IMPLANT
GLOVE BIO SURGEON STRL SZ7.5 (GLOVE) ×1 IMPLANT
GLOVE BIOGEL PI IND STRL 8 (GLOVE) ×2 IMPLANT
GLOVE SURG ORTHO 8.0 STRL STRW (GLOVE) ×1 IMPLANT
GOWN STRL REUS W/ TWL XL LVL3 (GOWN DISPOSABLE) ×2 IMPLANT
HOLDER FOLEY CATH W/STRAP (MISCELLANEOUS) IMPLANT
IMMOBILIZER KNEE 20 THIGH 36 (SOFTGOODS) ×1 IMPLANT
INSERT TIB ARTISURF SZ8-11X12 (Insert) IMPLANT
KIT TURNOVER KIT A (KITS) ×1 IMPLANT
MANIFOLD NEPTUNE II (INSTRUMENTS) ×1 IMPLANT
NS IRRIG 1000ML POUR BTL (IV SOLUTION) ×1 IMPLANT
PACK TOTAL KNEE CUSTOM (KITS) ×1 IMPLANT
PAD COLD SHLDR WRAP-ON (PAD) ×1 IMPLANT
PADDING CAST COTTON 6X4 STRL (CAST SUPPLIES) ×2 IMPLANT
PIN DRILL HDLS TROCAR 75 4PK (PIN) IMPLANT
PROSTHESIS TIB KNEE PS 0D F RT (Joint) IMPLANT
PROTECTOR NERVE ULNAR (MISCELLANEOUS) ×1 IMPLANT
SCREW FEMALE HEX FIX 25X2.5 (ORTHOPEDIC DISPOSABLE SUPPLIES) IMPLANT
SET HNDPC FAN SPRY TIP SCT (DISPOSABLE) ×1 IMPLANT
SET PAD KNEE POSITIONER (MISCELLANEOUS) ×1 IMPLANT
SOLN STERILE WATER BTL 1000 ML (IV SOLUTION) ×2 IMPLANT
SPIKE FLUID TRANSFER (MISCELLANEOUS) IMPLANT
STAPLER SKIN PROX 35W (STAPLE) IMPLANT
STRIP CLOSURE SKIN 1/2X4 (GAUZE/BANDAGES/DRESSINGS) IMPLANT
SUT MNCRL AB 4-0 PS2 18 (SUTURE) IMPLANT
SUT VIC AB 0 CT1 36 (SUTURE) ×1 IMPLANT
SUT VIC AB 1 CT1 36 (SUTURE) ×2 IMPLANT
SUT VIC AB 2-0 CT1 TAPERPNT 27 (SUTURE) ×2 IMPLANT
TOWEL GREEN STERILE FF (TOWEL DISPOSABLE) ×1 IMPLANT
TRAY FOLEY MTR SLVR 16FR STAT (SET/KITS/TRAYS/PACK) IMPLANT
YANKAUER SUCT BULB TIP NO VENT (SUCTIONS) ×1 IMPLANT

## 2024-07-19 NOTE — Discharge Instructions (Signed)
 Per [facility] clinic policy, our goal is ensure optimal postoperative pain control with a multimodal pain management strategy. For all [facility] patients, our goal is to wean post-operative narcotic medications by 6 weeks post-operatively. If this is not possible due to utilization of pain medication prior to surgery, your [facility] doctor will support your acute post-operative pain control for the first 6 weeks postoperatively, with a plan to transition you back to your primary pain team following that. Bruce Chambers will work to ensure a Therapist, occupational.  INSTRUCTIONS AFTER JOINT REPLACEMENT   Remove items at home which could result in a fall. This includes throw rugs or furniture in walking pathways ICE to the affected joint every three hours while awake for 30 minutes at a time, for at least the first 3-5 days, and then as needed for pain and swelling.  Continue to use ice for pain and swelling. You may notice swelling that will progress down to the foot and ankle.  This is normal after surgery.  Elevate your leg when you are not up walking on it.   Continue to use the breathing machine you got in the hospital (incentive spirometer) which will help keep your temperature down.  It is common for your temperature to cycle up and down following surgery, especially at night when you are not up moving around and exerting yourself.  The breathing machine keeps your lungs expanded and your temperature down.   DIET:  As you were doing prior to hospitalization, we recommend a well-balanced diet.  DRESSING / WOUND CARE / SHOWERING  Keep the surgical dressing until follow up.  The dressing is water  proof, so you can shower without any extra covering.  IF THE DRESSING FALLS OFF or the wound gets wet inside, change the dressing with sterile gauze.  Please use good hand washing techniques before changing the dressing.  Do not use any lotions or creams on the incision until instructed by your surgeon.     ACTIVITY  Increase activity slowly as tolerated, but follow the weight bearing instructions below.   No driving for 6 weeks or until further direction given by your physician.  You cannot drive while taking narcotics.  No lifting or carrying greater than 10 lbs. until further directed by your surgeon. Avoid periods of inactivity such as sitting longer than an hour when not asleep. This helps prevent blood clots.  You may return to work once you are authorized by your doctor.     WEIGHT BEARING   Weight bearing as tolerated with assist device (walker, cane, etc) as directed, use it as long as suggested by your surgeon or therapist, typically at least 4-6 weeks.   EXERCISES  Results after joint replacement surgery are often greatly improved when you follow the exercise, range of motion and muscle strengthening exercises prescribed by your doctor. Safety measures are also important to protect the joint from further injury. Any time any of these exercises cause you to have increased pain or swelling, decrease what you are doing until you are comfortable again and then slowly increase them. If you have problems or questions, call your caregiver or physical therapist for advice.   Rehabilitation is important following a joint replacement. After just a few days of immobilization, the muscles of the leg can become weakened and shrink (atrophy).  These exercises are designed to build up the tone and strength of the thigh and leg muscles and to improve motion. Often times heat used for twenty to thirty minutes before  working out will loosen up your tissues and help with improving the range of motion but do not use heat for the first two weeks following surgery (sometimes heat can increase post-operative swelling).   These exercises can be done on a training (exercise) mat, on the floor, on a table or on a bed. Use whatever works the best and is most comfortable for you.    Use music or television  while you are exercising so that the exercises are a pleasant break in your day. This will make your life better with the exercises acting as a break in your routine that you can look forward to.   Perform all exercises about fifteen times, three times per day or as directed.  You should exercise both the operative leg and the other leg as well.  Exercises include:   Quad Sets - Tighten up the muscle on the front of the thigh (Quad) and hold for 5-10 seconds.   Straight Leg Raises - With your knee straight (if you were given a brace, keep it on), lift the leg to 60 degrees, hold for 3 seconds, and slowly lower the leg.  Perform this exercise against resistance later as your leg gets stronger.  Leg Slides: Lying on your back, slowly slide your foot toward your buttocks, bending your knee up off the floor (only go as far as is comfortable). Then slowly slide your foot back down until your leg is flat on the floor again.  Angel Wings: Lying on your back spread your legs to the side as far apart as you can without causing discomfort.  Hamstring Strength:  Lying on your back, push your heel against the floor with your leg straight by tightening up the muscles of your buttocks.  Repeat, but this time bend your knee to a comfortable angle, and push your heel against the floor.  You may put a pillow under the heel to make it more comfortable if necessary.   A rehabilitation program following joint replacement surgery can speed recovery and prevent re-injury in the future due to weakened muscles. Contact your doctor or a physical therapist for more information on knee rehabilitation.    CONSTIPATION  Constipation is defined medically as fewer than three stools per week and severe constipation as less than one stool per week.  Even if you have a regular bowel pattern at home, your normal regimen is likely to be disrupted due to multiple reasons following surgery.  Combination of anesthesia, postoperative  narcotics, change in appetite and fluid intake all can affect your bowels.   YOU MUST use at least one of the following options; they are listed in order of increasing strength to get the job done.  They are all available over the counter, and you may need to use some, POSSIBLY even all of these options:    Drink plenty of fluids (prune juice may be helpful) and high fiber foods Colace 100 mg by mouth twice a day  Senokot for constipation as directed and as needed Dulcolax (bisacodyl), take with full glass of water  Miralax (polyethylene glycol) once or twice a day as needed.  If you have tried all these things and are unable to have a bowel movement in the first 3-4 days after surgery call either your surgeon or your primary doctor.    If you experience loose stools or diarrhea, hold the medications until you stool forms back up.  If your symptoms do not get better within 1 week  or if they get worse, check with your doctor.  If you experience the worst abdominal pain ever or develop nausea or vomiting, please contact the office immediately for further recommendations for treatment.   ITCHING:  If you experience itching with your medications, try taking only a single pain pill, or even half a pain pill at a time.  You can also use Benadryl  over the counter for itching or also to help with sleep.   TED HOSE STOCKINGS:  Use stockings on both legs until for at least 2 weeks or as directed by physician office. They may be removed at night for sleeping.  MEDICATIONS:  See your medication summary on the "After Visit Summary" that nursing will review with you.  You may have some home medications which will be placed on hold until you complete the course of blood thinner medication.  It is important for you to complete the blood thinner medication as prescribed.  PRECAUTIONS:  If you experience chest pain or shortness of breath - call 911 immediately for transfer to the hospital emergency department.    If you develop a fever greater that 101 F, purulent drainage from wound, increased redness or drainage from wound, foul odor from the wound/dressing, or calf pain - CONTACT YOUR SURGEON.                                                   FOLLOW-UP APPOINTMENTS:  If you do not already have a post-op appointment, please call the office for an appointment to be seen by your surgeon.  Guidelines for how soon to be seen are listed in your "After Visit Summary", but are typically between 1-4 weeks after surgery.  OTHER INSTRUCTIONS:   Knee Replacement:  Do not place pillow under knee, focus on keeping the knee straight while resting. CPM instructions: 0-90 degrees, 2 hours in the morning, 2 hours in the afternoon, and 2 hours in the evening. Place foam block, curve side up under heel at all times except when in CPM or when walking.  DO NOT modify, tear, cut, or change the foam block in any way.  POST-OPERATIVE OPIOID TAPER INSTRUCTIONS: It is important to wean off of your opioid medication as soon as possible. If you do not need pain medication after your surgery it is ok to stop day one. Opioids include: Codeine, Hydrocodone(Norco, Vicodin), Oxycodone (Percocet, oxycontin ) and hydromorphone  amongst others.  Long term and even short term use of opiods can cause: Increased pain response Dependence Constipation Depression Respiratory depression And more.  Withdrawal symptoms can include Flu like symptoms Nausea, vomiting And more Techniques to manage these symptoms Hydrate well Eat regular healthy meals Stay active Use relaxation techniques(deep breathing, meditating, yoga) Do Not substitute Alcohol to help with tapering If you have been on opioids for less than two weeks and do not have pain than it is ok to stop all together.  Plan to wean off of opioids This plan should start within one week post op of your joint replacement. Maintain the same interval or time between taking each dose  and first decrease the dose.  Cut the total daily intake of opioids by one tablet each day Next start to increase the time between doses. The last dose that should be eliminated is the evening dose.   MAKE SURE YOU:  Understand these instructions.  Get help right away if you are not doing well or get worse.    Thank you for letting us  be a part of your medical care team.  It is a privilege we respect greatly.  We hope these instructions will help you stay on track for a fast and full recovery!     Dental Antibiotics:  In most cases prophylactic antibiotics for Dental procdeures after total joint surgery are not necessary.  Exceptions are as follows:  1. History of prior total joint infection  2. Severely immunocompromised (Organ Transplant, cancer chemotherapy, Rheumatoid biologic meds such as Humera)  3. Poorly controlled diabetes (A1C &gt; 8.0, blood glucose over 200)  If you have one of these conditions, contact your surgeon for an antibiotic prescription, prior to your dental procedure.

## 2024-07-19 NOTE — Anesthesia Postprocedure Evaluation (Signed)
"   Anesthesia Post Note  Patient: Bruce Chambers.  Procedure(s) Performed: ARTHROPLASTY, KNEE, TOTAL (Right: Knee)     Patient location during evaluation: PACU Anesthesia Type: Spinal Level of consciousness: awake and alert and oriented Pain management: pain level controlled Vital Signs Assessment: post-procedure vital signs reviewed and stable Respiratory status: spontaneous breathing, nonlabored ventilation and respiratory function stable Cardiovascular status: blood pressure returned to baseline and stable Postop Assessment: no apparent nausea or vomiting Anesthetic complications: no   No notable events documented.  Last Vitals:  Vitals:   07/19/24 1230 07/19/24 1245  BP: (!) 143/87 (!) 137/95  Pulse: (!) 49 (!) 51  Resp: (!) 21 15  Temp:    SpO2: 99% 100%    Last Pain:  Vitals:   07/19/24 1245  TempSrc:   PainSc: 0-No pain                 Bertram Haddix A.      "

## 2024-07-19 NOTE — Anesthesia Procedure Notes (Signed)
 Spinal  Patient location during procedure: OR Start time: 07/19/2024 9:55 AM End time: 07/19/2024 10:00 AM Reason for block: surgical anesthesia  Staffing Performed: anesthesiologist  Authorized by: Jerrye Sharper, MD   Performed by: Jerrye Sharper, MD  Preanesthetic Checklist Completed: patient identified, IV checked, site marked, risks and benefits discussed, surgical consent, monitors and equipment checked, pre-op evaluation and timeout performed Spinal Block Patient position: sitting Prep: DuraPrep and site prepped and draped Patient monitoring: heart rate, cardiac monitor, continuous pulse ox and blood pressure Approach: midline Location: L3-4 Injection technique: single-shot Needle Needle type: Pencan  Needle gauge: 24 G Needle length: 9 cm Needle insertion depth (cm): 7 Assessment Sensory level: T4 Events: CSF return  Additional Notes Patient tolerated procedure well. Adequate sensory level.

## 2024-07-19 NOTE — Evaluation (Signed)
 Physical Therapy Evaluation Patient Details Name: Bruce Chambers. MRN: [id number] DOB: 1961/05/10 Today's Date: 07/19/2024  History of Present Illness  64 yo male presents to therapy s/p R TKA on 07/19/2024 due to failure of conservative measures. Pt PMH includes but is not limited to: bipolar disorder, OA, HLD, peripheral neuropathy, spinal stenosis and LBP s/p surgery, HTN, PVD, and L THA (2022).  Clinical Impression      Bruce Chambers. is a 64 y.o. male POD 0 s/p R TKA. Patient reports IND with mobility at baseline. Patient is now limited by functional impairments (see PT problem list below) and requires CGA for bed mobility and CGA for transfers. Patient was able to ambulate 55 feet with RW and CGA level of assist. Patient instructed in exercise to facilitate ROM and circulation to manage edema.  Patient will benefit from continued skilled PT interventions to address impairments and progress towards PLOF. Acute PT will follow to progress mobility and stair training in preparation for safe discharge home with family support and [facility] services.     If plan is discharge home, recommend the following: A little help with walking and/or transfers;A little help with bathing/dressing/bathroom;Assistance with cooking/housework;Assist for transportation;Help with stairs or ramp for entrance   Can travel by private vehicle        Equipment Recommendations Rolling walker (2 wheels)  Recommendations for Other Services       Functional Status Assessment Patient has had a recent decline in their functional status and demonstrates the ability to make significant improvements in function in a reasonable and predictable amount of time.     Precautions / Restrictions Precautions Precautions: Knee;Fall Restrictions Weight Bearing Restrictions Per Provider Order: No      Mobility  Bed Mobility Overal bed mobility: Needs Assistance Bed Mobility: Supine to Sit     Supine to sit: Contact guard,  HOB elevated, Used rails     General bed mobility comments: min cues    Transfers Overall transfer level: Needs assistance Equipment used: Rolling walker (2 wheels) Transfers: Sit to/from Stand Sit to Stand: Contact guard assist           General transfer comment: mini cues    Ambulation/Gait Ambulation/Gait assistance: Contact guard assist Gait Distance (Feet): 55 Feet Assistive device: Rolling walker (2 wheels) Gait Pattern/deviations: Step-to pattern, Antalgic, Decreased stance time - right, Trunk flexed Gait velocity: decreased     General Gait Details: slight trunk flexion and B UE support at RW to offload R LE in stance phase, pt limtied with R knee flexion with swing phase reporting increased pain, pt requried min cues for safety, direction and Rw  management with encoruagement for R LE WBAT  Stairs            Wheelchair Mobility     Tilt Bed    Modified Rankin (Stroke Patients Only)       Balance Overall balance assessment: Needs assistance Sitting-balance support: Feet supported Sitting balance-Leahy Scale: Good     Standing balance support: Bilateral upper extremity supported, During functional activity, Reliant on assistive device for balance Standing balance-Leahy Scale: Poor                               Pertinent Vitals/Pain Pain Assessment Pain Assessment: 0-10 Pain Score: 3  Pain Location: R knee and LE Pain Descriptors / Indicators: Aching, Constant, Grimacing, Operative site guarding Pain Intervention(s): Limited activity within patient's  tolerance, Monitored during session, Premedicated before session, Repositioned, Patient requesting pain meds-RN notified, Ice applied    Home Living Family/patient expects to be discharged to:: Private residence Living Arrangements: Spouse/significant other;Children Available Help at Discharge: Family Type of Home: House Home Access: Stairs to enter Entrance Stairs-Rails:   (handrial/bars) Entrance Stairs-Number of Steps: 2   Home Layout: One level Home Equipment: Cane - single point;Crutches      Prior Function Prior Level of Function : Independent/Modified Independent;Driving             Mobility Comments: IND no AD for all ADLs, self care tasks and IADLs       Extremity/Trunk Assessment        Lower Extremity Assessment Lower Extremity Assessment: RLE deficits/detail RLE Deficits / Details: ankle DF/PF 5/5; SLR < 10 degree lag RLE Sensation: history of peripheral neuropathy    Cervical / Trunk Assessment Cervical / Trunk Assessment: Back Surgery  Communication   Communication Communication: No apparent difficulties    Cognition Arousal: Alert Behavior During Therapy: WFL for tasks assessed/performed   PT - Cognitive impairments: No apparent impairments                         Following commands: Intact       Cueing       General Comments      Exercises Total Joint Exercises Ankle Circles/Pumps: AROM, Both, 15 reps Straight Leg Raises: AROM, Right, 5 reps   Assessment/Plan    PT Assessment Patient needs continued PT services  PT Problem List Decreased strength;Decreased range of motion;Decreased activity tolerance;Decreased mobility;Decreased balance;Decreased coordination;Pain       PT Treatment Interventions DME instruction;Gait training;Stair training;Functional mobility training;Therapeutic activities;Therapeutic exercise;Balance training;Neuromuscular re-education;Patient/family education;Modalities    PT Goals (Current goals can be found in the Care Plan section)  Acute Rehab PT Goals Patient Stated Goal: to be back to normal and get the other knee done PT Goal Formulation: With patient Time For Goal Achievement: 08/02/24 Potential to Achieve Goals: Good    Frequency 7X/week     Co-evaluation               AM-PAC PT 6 Clicks Mobility  Outcome Measure Help needed turning from your  back to your side while in a flat bed without using bedrails?: None Help needed moving from lying on your back to sitting on the side of a flat bed without using bedrails?: A Little Help needed moving to and from a bed to a chair (including a wheelchair)?: A Little Help needed standing up from a chair using your arms (e.g., wheelchair or bedside chair)?: A Little Help needed to walk in hospital room?: A Little Help needed climbing 3-5 steps with a railing? : Total 6 Click Score: 17    End of Session Equipment Utilized During Treatment: Gait belt Activity Tolerance: Patient limited by pain Patient left: in chair;with call bell/phone within reach;with chair alarm set Nurse Communication: Mobility status;Patient requests pain meds PT Visit Diagnosis: Unsteadiness on feet (R26.81);Other abnormalities of gait and mobility (R26.89);Muscle weakness (generalized) (M62.81);Difficulty in walking, not elsewhere classified (R26.2);Pain Pain - Right/Left: Right Pain - part of body: Knee;Leg    Time: 1739-1810 PT Time Calculation (min) (ACUTE ONLY): 31 min   Charges:   PT Evaluation $PT Eval Low Complexity: 1 Low PT Treatments $Gait Training: 8-22 mins PT General Charges $$ ACUTE PT VISIT: 1 Visit         [place], PT  Acute Rehab   [place] VEAR Drone 07/19/2024, 6:58 PM

## 2024-07-19 NOTE — Transfer of Care (Signed)
 Immediate Anesthesia Transfer of Care Note  Patient: Bruce Chambers.  Procedure(s) Performed: ARTHROPLASTY, KNEE, TOTAL (Right: Knee)  Patient Location: PACU  Anesthesia Type:MAC and Spinal  Level of Consciousness: awake, alert , oriented, and patient cooperative  Airway & Oxygen Therapy: Patient Spontanous Breathing and Patient connected to face mask oxygen  Post-op Assessment: Report given to RN and Post -op Vital signs reviewed and stable  Post vital signs: Reviewed and stable  Last Vitals:  Vitals Value Taken Time  BP 113/69 07/19/24 11:46  Temp    Pulse 56 07/19/24 11:47  Resp 16 07/19/24 11:47  SpO2 100 % 07/19/24 11:47  Vitals shown include unfiled device data.  Last Pain:  Vitals:   07/19/24 0840  TempSrc:   PainSc: 0-No pain         Complications: No notable events documented.

## 2024-07-19 NOTE — Interval H&P Note (Signed)
 History and Physical Interval Note: The patient understands that he is here today for a right total knee replacement to treat his significant right knee pain and arthritis.  There has been no acute or interval change in his medical status.  The risks and benefits of surgery have been discussed in detail and informed consent has been obtained.  The right operative knee has been marked.  07/19/2024 8:42 AM  Bruce Chambers.  has presented today for surgery, with the diagnosis of osteoarthritis right knee.  The various methods of treatment have been discussed with the patient and family. After consideration of risks, benefits and other options for treatment, the patient has consented to  Procedures: ARTHROPLASTY, KNEE, TOTAL (Right) as a surgical intervention.  The patient's history has been reviewed, patient examined, no change in status, stable for surgery.  I have reviewed the patient's chart and labs.  Questions were answered to the patient's satisfaction.     [name]

## 2024-07-19 NOTE — Anesthesia Procedure Notes (Signed)
 Anesthesia Regional Block: Adductor canal block   Pre-Anesthetic Checklist: , timeout performed,  Correct Patient, Correct Site, Correct Laterality,  Correct Procedure, Correct Position, site marked,  Risks and benefits discussed,  Surgical consent,  Pre-op evaluation,  At surgeon's request and post-op pain management  Laterality: Right  Prep: chloraprep       Needles:  Injection technique: Single-shot  Needle Type: Echogenic Stimulator Needle     Needle Length: 10cm  Needle Gauge: 21   Needle insertion depth (cm): 6   Additional Needles:   Procedures:,,,, ultrasound used (permanent image in chart),,    Narrative:  Start time: 07/19/2024 8:35 AM End time: 07/19/2024 8:40 AM Injection made incrementally with aspirations every 5 mL.  Performed by: Personally  Anesthesiologist: Jerrye Sharper, MD  Additional Notes: Timeout performed. Patient sedated. Relevant anatomy ID'd using US . Incremental 2-5ml injection of LA with frequent aspiration. Patient tolerated procedure well.

## 2024-07-19 NOTE — Anesthesia Preprocedure Evaluation (Addendum)
"                                    Anesthesia Evaluation  Patient identified by MRN, date of birth, ID band Patient awake    Reviewed: Allergy & Precautions, NPO status , Patient's Chart, lab work & pertinent test results, reviewed documented beta blocker date and time   History of Anesthesia Complications (+) Family history of anesthesia reaction  Airway Mallampati: II  TM Distance: >3 FB     Dental no notable dental hx. (+) Caps, Dental Advisory Given   Pulmonary neg pulmonary ROS   breath sounds clear to auscultation       Cardiovascular hypertension, Pt. on medications and Pt. on home beta blockers + angina with exertion + CAD and + Peripheral Vascular Disease  Normal cardiovascular exam+ dysrhythmias  Rhythm:Regular Rate:Normal     Neuro/Psych  PSYCHIATRIC DISORDERS  Depression Bipolar Disorder   Peripheral neuropathy  Neuromuscular disease    GI/Hepatic Neg liver ROS,GERD  Medicated,,  Endo/Other  HLD  Renal/GU negative Renal ROS  negative genitourinary   Musculoskeletal  (+) Arthritis , Osteoarthritis,  OA right knee Psoriasis Hx/o lumbar spinal stenosis S/P laminectomy   Abdominal   Peds  Hematology Plavix  therapy- last dose   Anesthesia Other Findings   Reproductive/Obstetrics                              Anesthesia Physical Anesthesia Plan  ASA: 3  Anesthesia Plan: General   Post-op Pain Management: Minimal or no pain anticipated and Regional block*   Induction: Intravenous  PONV Risk Score and Plan: 3 and Treatment may vary due to age or medical condition and Propofol infusion  Airway Management Planned: Natural Airway and Simple Face Mask  Additional Equipment: None  Intra-op Plan:   Post-operative Plan:   Informed Consent: I have reviewed the patients History and Physical, chart, labs and discussed the procedure including the risks, benefits and alternatives for the proposed anesthesia with the  patient or authorized representative who has indicated his/her understanding and acceptance.     Dental advisory given  Plan Discussed with: CRNA and Anesthesiologist  Anesthesia Plan Comments:          Anesthesia Quick Evaluation  "

## 2024-07-19 NOTE — Op Note (Signed)
 "    Operative Note  Date of operation: 07/19/2024 Preoperative diagnosis: Right knee primary osteoarthritis Postoperative diagnosis: Same  Procedure: Right press-fit total knee arthroplasty  Implants: Biomet/Zimmer persona press-fit knee system (CR medial congruent) Implant Name Type Inv. Item Serial No. Manufacturer Lot No. LRB No. Used Action  PROSTHESIS TIB KNEE PS 0D F RT - [id number] Joint PROSTHESIS TIB KNEE PS 0D F RT  ZIMMER RECON(ORTH,TRAU,BIO,SG) [id number] Right 1 Implanted  INSERT TIB ARTISURF [id number] - [id number] Insert INSERT TIB ARTISURF [id number]  ZIMMER RECON(ORTH,TRAU,BIO,SG) [id number] Right 1 Implanted  COMPONENT PATELLA 3 PEG 35 - [id number] Joint COMPONENT PATELLA 3 PEG 35  ZIMMER RECON(ORTH,TRAU,BIO,SG) [id number] Right 1 Implanted  COMPONENT FEM PS KN STD 10 RT - [id number] Joint COMPONENT FEM PS KN STD 10 RT  ZIMMER RECON(ORTH,TRAU,BIO,SG) [id number] Right 1 Implanted   Surgeon: Lonni GRADE. Vernetta, MD Assistant: Almeda Rummer, PA-C  Anesthesia: #1 right lower extremity adductor canal block, #2 spinal, #3 local Tourniquet time: Under 1 hour EBL: Less than 50 cc Antibiotics: IV Ancef Complications: None  Indications: The patient is a 64 year old gentleman with debilitating arthritis involving both of his knees with the right much worse than the left.  The right knee has a varus malalignment with bone-on-bone wear on x-rays of the medial compartment and patellofemoral joint.  He has tried and failed conservative treatment for over a year now.  At this point his right knee pain is daily and it is detrimentally affecting his mobility, his quality of life and his actives daily living to the point he does wish to proceed with a knee replacement on the right side.  We did discuss the risks of acute blood loss anemia, nerve and vessel injury, fracture, infection, DVT, implant failure and wound healing issues.  He understands that our goals are hopefully decreased pain, improved  mobility and improve quality life.  Procedure description: After informed consent was obtained the appropriate right knee was marked, anesthesia obtained a right lower extremity adductor canal block in the holding room.  The patient was then brought to the operating room and sat up on the operating table where spinal anesthesia was obtained.  He was laid in spine position operating table and a Foley catheter was placed.  A nonsterile current was placed on his upper right thigh and his right thigh, knee, leg, and ankle were prepped and draped in DuraPrep and sterile drape including a sterile stockinette.  A timeout was called and he was identified as the correct patient the correct right knee.  An Esmarch was used to wrap out the leg and the tourniquet was plated 300 mm pressure.  With the knee extended a direct midline incision was made over the patella and carried proximally distally.  Dissection was carried down to the knee joint and a medial parapatellar arthrotomy is made finding a moderate joint effusion.  With the knee in a flexed position we found significant and complete cartilage wear of the medial compartment the knee and the patellofemoral joint.  Osteophytes removed from all 3 compartments as well as the ACL and medial lateral meniscus.  We then used x-ray to base cutting guide for making her proximal tibia cut correcting for varus and valgus and a 7 degree slope.  We made this cut to take 2 mm off the low side and we did back it down to more millimeters.  We then went to the femur and put our distal femoral cutting block based on an intramedullary alignment guide through the  notch of the femur setting this for the right knee at 5 degrees external rotated and a 10 mm distal femoral cut.  We made this cut without difficulty and brought the knee back down to full extension and achieve full extension with a 10 mm extension block.  We then go back to the femur and put a femoral sizing guide based off the  epicondylar axis and Whitesides line.  Based off of this we chose a size 10 femur.  We put a 4-in-1 cutting block for a size 10 femur and made our anterior and posterior cuts followed by her chamfer cuts.  We then backed the tibia and chose a size F right tibial tray for coverage over the tibial plateau so the rotation of the tibial tubercle and the femur.  We did our drill hole and keel punch off of this.  We then trialed a size F right tibia combined with her size 10 right CR femur.  We trialed up to a 12 mm thickness right medial congruent path and insert we are pleased with range of motion and stability without insert.  Of note we did find good quality bone for proceeding with press-fit implants.  We then made a patella cut and drilled 3 holes for a size 35 press-fit patella button.  We then removed all trial instrumentation from the knee and irrigated the knee with normal saline solution using pulsatile lavage.  We then placed Marcaine with epinephrine around the arthrotomy.  Next we dried the knee real well and with the knee in a flexed position placed our Biomet/Zimmer press-fit tibial tray for right knee size F followed by press fitting our size 10 right CR standard femur.  We placed our 12 mm thickness right medial congruent path and insert and press-fit our size 35 patella button.  With all real components in the knee we put the knee through range of motion and we are pleased with range of motion and stability.  The tourniquet was then let down and hemostasis was obtained with electrocautery.  The arthrotomy was then closed with interrupted #1 Vicryl suture followed by 0 Vicryl goes deep tissue and 2-0 Vicryl close subcu tissue.  The skin was closed with staples.  Well-padded sterile dressings applied.  The patient was taken off the operating table and taken the recovery.  Almeda Rummer, PA-C did assist during the entire case and beginning to end and her assistance was crucial and medically necessary for  soft tissue management and retraction, helping guide implant placement and a layered closure of the wound. "

## 2024-07-19 NOTE — Anesthesia Procedure Notes (Signed)
 Procedure Name: MAC Date/Time: 07/19/2024 8:52 AM  Performed by: Memory Armida LABOR, CRNAPre-anesthesia Checklist: Patient identified, Emergency Drugs available, Suction available, Patient being monitored and Timeout performed Patient Re-evaluated:Patient Re-evaluated prior to induction Oxygen Delivery Method: Simple face mask Placement Confirmation: CO2 detector Dental Injury: Teeth and Oropharynx as per pre-operative assessment

## 2024-07-20 ENCOUNTER — Other Ambulatory Visit (HOSPITAL_COMMUNITY): Payer: Self-pay

## 2024-07-20 DIAGNOSIS — M1711 Unilateral primary osteoarthritis, right knee: Secondary | ICD-10-CM | POA: Diagnosis not present

## 2024-07-20 LAB — BASIC METABOLIC PANEL WITH GFR
Anion gap: 9 (ref 5–15)
BUN: 15 mg/dL (ref 8–23)
CO2: 26 mmol/L (ref 22–32)
Calcium: 8.9 mg/dL (ref 8.9–10.3)
Chloride: 103 mmol/L (ref 98–111)
Creatinine, Ser: 1.04 mg/dL (ref 0.61–1.24)
GFR, Estimated: >60 mL/min
Glucose, Bld: 115 mg/dL — ABNORMAL HIGH (ref 70–99)
Potassium: 4.3 mmol/L (ref 3.5–5.1)
Sodium: 138 mmol/L (ref 135–145)

## 2024-07-20 LAB — CBC
HCT: 41.3 % (ref 39.0–52.0)
Hemoglobin: 14.1 g/dL (ref 13.0–17.0)
MCH: 31.7 pg (ref 26.0–34.0)
MCHC: 34.1 g/dL (ref 30.0–36.0)
MCV: 92.8 fL (ref 80.0–100.0)
Platelets: 241 K/uL (ref 150–400)
RBC: 4.45 MIL/uL (ref 4.22–5.81)
RDW: 13.1 % (ref 11.5–15.5)
WBC: 18.8 K/uL — ABNORMAL HIGH (ref 4.0–10.5)
nRBC: 0 % (ref 0.0–0.2)

## 2024-07-20 MED ORDER — OXYCODONE HCL 5 MG PO TABS
5.0000 mg | ORAL_TABLET | ORAL | 0 refills | Status: DC | PRN
  Filled 2024-07-20: qty 30, 3d supply, fill #0

## 2024-07-20 MED ORDER — TIZANIDINE HCL 4 MG PO TABS
4.0000 mg | ORAL_TABLET | Freq: Four times a day (QID) | ORAL | 0 refills | Status: DC | PRN
  Filled 2024-07-20: qty 30, 8d supply, fill #0

## 2024-07-20 NOTE — Discharge Summary (Signed)
 " Patient ID: Bruce Chambers. MRN: [id number] DOB/AGE: 66-Sep-1962 66 y.o.  Admit date: 07/19/2024 Discharge date: 07/20/2024  Admission Diagnoses:  Principal Problem:   Unilateral primary osteoarthritis, right knee Active Problems:   Status post total right knee replacement   Discharge Diagnoses:  Same  Past Medical History:  Diagnosis Date   Arthritis    Bipolar 1 disorder (HCC)    Dysplastic nevus 11/29/2023   Right infrascapular, moderate atypia   Dysrhythmia    PVCs  has had ablations   Family history of adverse reaction to anesthesia    Father hallucinations,   GERD (gastroesophageal reflux disease)    Hypertension    Neuromuscular disorder (HCC)    neuropathy BLE, sciatica   Peripheral vascular disease    claudication   Psoriasis    PVC (premature ventricular contraction)     Surgeries: Procedures: ARTHROPLASTY, KNEE, TOTAL on 07/19/2024   Consultants:   Discharged Condition: Improved  Hospital Course: Tenzin Edelman. is an 64 y.o. male who was admitted 07/19/2024 for operative treatment ofUnilateral primary osteoarthritis, right knee. Patient has severe unremitting pain that affects sleep, daily activities, and work/hobbies. After pre-op clearance the patient was taken to the operating room on 07/19/2024 and underwent  Procedures: ARTHROPLASTY, KNEE, TOTAL.    Patient was given perioperative antibiotics:  Anti-infectives (From admission, onward)    Start     Dose/Rate Route Frequency Ordered Stop   07/19/24 1600  ceFAZolin  (ANCEF ) IVPB 2g/100 mL premix        2 g 200 mL/hr over 30 Minutes Intravenous Every 6 hours 07/19/24 1329 07/20/24 0840   07/19/24 0745  ceFAZolin  (ANCEF ) IVPB 2g/100 mL premix        2 g 200 mL/hr over 30 Minutes Intravenous On call to O.R. 07/19/24 9261 07/19/24 1031        Patient was given sequential compression devices, early ambulation, and chemoprophylaxis to prevent DVT.  Inpatient Morphine  Milligram Equivalents Per Day 1/2 -  1/3   Values displayed are in units of MME/Day    Order Start / End Date Yesterday Today    fentaNYL  (SUBLIMAZE ) injection 25-50 mcg 1/2 - 1/2 0 of 45-90 --    fentaNYL  (SUBLIMAZE ) injection 50 mcg 1/2 - 1/2 15 of 15 --    HYDROmorphone  (DILAUDID ) injection 0.5-1 mg 1/2 - No end date 0 of 30-60 0 of 60-120    oxyCODONE  (Oxy IR/ROXICODONE ) immediate release tablet 5-10 mg 1/2 - No end date 22.5 of 22.5-45 30 of 45-90    oxyCODONE  (Oxy IR/ROXICODONE ) immediate release tablet 10-15 mg 1/2 - No end date 0 of 45-67.5 0 of 90-135    Daily Totals  37.5 of 157.5-277.5 30 of 195-345       Patient benefited maximally from hospital stay and there were no complications.    Recent vital signs: Patient Vitals for the past 24 hrs:  BP Temp Temp src Pulse Resp SpO2  07/20/24 1017 (!) 152/78 98.1 F (36.7 C) Oral (!) 56 16 99 %  07/20/24 1009 -- -- -- 64 -- 95 %  07/20/24 0836 -- -- -- 78 -- --  07/20/24 0439 112/70 (!) 97.4 F (36.3 C) Oral (!) 56 16 98 %  07/20/24 0107 133/76 (!) 97.4 F (36.3 C) Oral (!) 54 15 98 %  07/19/24 2207 124/77 98.3 F (36.8 C) Oral 67 15 97 %  07/19/24 1736 (!) 147/86 97.9 F (36.6 C) Oral 75 16 99 %  07/19/24 1342 (!)  149/86 97.8 F (36.6 C) Oral (!) 53 16 98 %  07/19/24 1315 (!) 144/87 97.8 F (36.6 C) -- (!) 54 20 96 %  07/19/24 1300 (!) 143/93 -- -- (!) 52 19 100 %  07/19/24 1245 (!) 137/95 -- -- (!) 51 15 100 %  07/19/24 1230 (!) 143/87 -- -- (!) 49 (!) 21 99 %  07/19/24 1215 130/83 -- -- (!) 50 16 98 %  07/19/24 1200 129/80 -- -- (!) 52 17 99 %  07/19/24 1150 113/69 (!) 97 F (36.1 C) -- (!) 53 15 100 %     Recent laboratory studies:  Recent Labs    07/20/24 0330  WBC 18.8*  HGB 14.1  HCT 41.3  PLT 241  NA 138  K 4.3  CL 103  CO2 26  BUN 15  CREATININE 1.04  GLUCOSE 115*  CALCIUM 8.9     Discharge Medications:   Allergies as of 07/20/2024       Reactions   Gabapentin Other (See Comments)   Changed mood and did not like the way he felt  on it.   Rosuvastatin Other (See Comments)   Muscle/joint pain   Other Rash   Allergic  to [place] Peppers        Medication List     TAKE these medications    aspirin  EC 81 MG tablet Take 81 mg by mouth in the morning.   buPROPion  300 MG 24 hr tablet Commonly known as: WELLBUTRIN  XL Take 1 tablet (300 mg total) by mouth daily.   clopidogrel  75 MG tablet Commonly known as: PLAVIX  Take 1 tablet (75 mg total) by mouth daily.   Humira  (2 Pen) 40 MG/0.4ML pen Generic drug: adalimumab  Inject 0.4 mLs (40 mg total) into the skin every 14 (fourteen) days. Inject 0.4 mL (40 mg) every 14 days.   lamoTRIgine  200 MG tablet Commonly known as: LAMICTAL  Take 200 mg by mouth in the morning.   metoprolol  succinate 25 MG 24 hr tablet Commonly known as: TOPROL -XL Take 25 mg by mouth in the morning.   oxyCODONE  5 MG immediate release tablet Commonly known as: Oxy IR/ROXICODONE  Take 1-2 tablets (5-10 mg total) by mouth every 4 (four) hours as needed for moderate pain (pain score 4-6) (pain score 4-6).   Repatha SureClick 140 MG/ML Soaj Generic drug: Evolocumab Inject 140 mg into the skin every 14 (fourteen) days.   tiZANidine  4 MG tablet Commonly known as: ZANAFLEX  Take 1 tablet (4 mg total) by mouth every 6 (six) hours as needed for muscle spasms.   valsartan 40 MG tablet Commonly known as: DIOVAN Take 80 mg by mouth in the morning.               Durable Medical Equipment  (From admission, onward)           Start     Ordered   07/19/24 1330  DME 3 n 1  Once        07/19/24 1329   07/19/24 1330  DME Walker rolling  Once       Question Answer Comment  Walker: With 5 Inch Wheels   Patient needs a walker to treat with the following condition Status post total right knee replacement      07/19/24 1329            Diagnostic Studies: DG Knee Right Port Result Date: 07/19/2024 CLINICAL DATA:  Status post right knee replacement. EXAM: PORTABLE RIGHT KNEE - 1-2 VIEW  COMPARISON:  None Available. FINDINGS: Right knee arthroplasty in expected alignment. No periprosthetic lucency or fracture. Recent postsurgical change includes air and edema in the soft tissues and joint space. Anterior skin staples in place. IMPRESSION: Right knee arthroplasty without immediate postoperative complication. Electronically Signed   By: Andrea Gasman M.D.   On: 07/19/2024 13:53    Disposition: Discharge disposition: 01-Home or Self Care          Contact information for follow-up providers     Vernetta Lonni GRADE, MD Follow up in 2 week(s).   Specialty: Orthopedic Surgery Contact information: [address] [place]  [place] [address] [phone]         [place], [facility] Follow up.   Specialty: Home Health Services Why: Agency will provide home health physical therapy. They will contact you. Contact information: [address] [place] [address] [phone]              Contact information for after-discharge care     Durable Medical Equipment     [facility] (DME) Follow up.   Service: Durable Medical Equipment Why: Agency will deliver rolling walker to room before discharge Contact information: [address] [place]  [address] [phone]                      Signed: Lonni GRADE Vernetta 07/20/2024, 11:30 AM    "

## 2024-07-20 NOTE — Plan of Care (Signed)
" °  Problem: Bowel/Gastric: Goal: Gastrointestinal status for postoperative course will improve Outcome: Progressing   Problem: Cardiac: Goal: Ability to maintain an adequate cardiac output Outcome: Progressing   Problem: Neurological: Goal: Will regain or maintain usual level of consciousness Outcome: Progressing   Problem: Respiratory: Goal: Will regain and/or maintain adequate ventilation Outcome: Progressing   Problem: Skin Integrity: Goal: Demonstrates signs of wound healing without infection Outcome: Progressing   Problem: Urinary Elimination: Goal: Ability to achieve and maintain adequate urine output Outcome: Progressing   Problem: Clinical Measurements: Goal: Respiratory complications will improve Outcome: Progressing   "

## 2024-07-20 NOTE — Care Management Obs Status (Signed)
 MEDICARE OBSERVATION STATUS NOTIFICATION   Patient Details  Name: Bruce Chambers. MRN: [id number] Date of Birth: 03-Nov-1960   Medicare Observation Status Notification Given:  Yes    Sonda Manuella Quill, RN 07/20/2024, 10:09 AM

## 2024-07-20 NOTE — Progress Notes (Signed)
 Discharge meds in a secure bag delivered to patient by this RN

## 2024-07-20 NOTE — Progress Notes (Signed)
 Subjective: 1 Day Post-Op Procedures (LRB): ARTHROPLASTY, KNEE, TOTAL (Right) Patient reports pain as moderate.    Objective: Vital signs in last 24 hours: Temp:  [97 F (36.1 C)-98.3 F (36.8 C)] 98.1 F (36.7 C) (01/03 1017) Pulse Rate:  [49-78] 56 (01/03 1017) Resp:  [15-21] 16 (01/03 1017) BP: (112-152)/(69-95) 152/78 (01/03 1017) SpO2:  [95 %-100 %] 99 % (01/03 1017)  Intake/Output from previous day: 01/02 0701 - 01/03 0700 In: 3335.4 [P.O.:960; I.V.:1975.4; IV Piggyback:400] Out: 2225 [Urine:2200; Blood:25] Intake/Output this shift: No intake/output data recorded.  Recent Labs    07/20/24 0330  HGB 14.1   Recent Labs    07/20/24 0330  WBC 18.8*  RBC 4.45  HCT 41.3  PLT 241   Recent Labs    07/20/24 0330  NA 138  K 4.3  CL 103  CO2 26  BUN 15  CREATININE 1.04  GLUCOSE 115*  CALCIUM 8.9   No results for input(s): LABPT, INR in the last 72 hours.  Sensation intact distally Intact pulses distally Dorsiflexion/Plantar flexion intact Incision: dressing C/D/I Compartment soft   Assessment/Plan: 1 Day Post-Op Procedures (LRB): ARTHROPLASTY, KNEE, TOTAL (Right) Up with therapy Discharge home with home health      [name] 07/20/2024, 11:11 AM

## 2024-07-20 NOTE — Progress Notes (Signed)
 Physical Therapy Treatment Patient Details Name: Bruce Chambers. MRN: [id number] DOB: Dec 14, 1960 Today's Date: 07/20/2024   History of Present Illness 64 yo male presents to therapy s/p R TKA on 07/19/2024 due to failure of conservative measures. Pt PMH includes but is not limited to: bipolar disorder, OA, HLD, peripheral neuropathy, spinal stenosis and LBP s/p surgery, HTN, PVD, and L THA (2022).    PT Comments  Pt is progressing well with mobility, he ambulated 130' with RW, no loss of balance. Stair training completed. Reviewed TKA HEP, pt demonstrates good understanding. R knee flexion AAROM ~35*, limited by pain. Pt is able to perform SLR independently.   If plan is discharge home, recommend the following: A little help with bathing/dressing/bathroom;Assistance with cooking/housework;Assist for transportation;Help with stairs or ramp for entrance   Can travel by private vehicle        Equipment Recommendations  Rolling walker (2 wheels)    Recommendations for Other Services       Precautions / Restrictions Precautions Precautions: Knee;Fall Precaution Booklet Issued: Yes (comment) Recall of Precautions/Restrictions: Intact Precaution/Restrictions Comments: reviewed no pillow under knee Restrictions Weight Bearing Restrictions Per Provider Order: No     Mobility  Bed Mobility               General bed mobility comments: up in recliner    Transfers Overall transfer level: Modified independent Equipment used: Rolling walker (2 wheels) Transfers: Sit to/from Stand Sit to Stand: Modified independent (Device/Increase time)                Ambulation/Gait Ambulation/Gait assistance: Supervision Gait Distance (Feet): 130 Feet Assistive device: Rolling walker (2 wheels) Gait Pattern/deviations: Step-to pattern, Antalgic, Decreased stance time - right, Trunk flexed Gait velocity: decreased     General Gait Details: VCs for heel strike, decr knee flexion R  during swing phase   Stairs Stairs: Yes Stairs assistance: Supervision Stair Management: One rail Right, Sideways, Step to pattern Number of Stairs: 2 General stair comments: VCs sequencing, 2 stairs x 2 trials   Wheelchair Mobility     Tilt Bed    Modified Rankin (Stroke Patients Only)       Balance Overall balance assessment: Needs assistance Sitting-balance support: Feet supported Sitting balance-Leahy Scale: Good     Standing balance support: Bilateral upper extremity supported, During functional activity, Reliant on assistive device for balance Standing balance-Leahy Scale: Fair                              Hotel Manager: No apparent difficulties  Cognition Arousal: Alert Behavior During Therapy: WFL for tasks assessed/performed   PT - Cognitive impairments: No apparent impairments                         Following commands: Intact      Cueing    Exercises Total Joint Exercises Ankle Circles/Pumps: AROM, Both, 15 reps Quad Sets: AROM, Both, 5 reps, Supine Short Arc Quad: AROM, Right, 5 reps, Supine Heel Slides: AAROM, Right, 5 reps, Supine Hip ABduction/ADduction: AROM, Right, 5 reps, Supine Straight Leg Raises: AROM, Right, 5 reps Long Arc Quad: AROM, Right, 5 reps, Seated Knee Flexion: AAROM, Right, 5 reps, Seated    General Comments        Pertinent Vitals/Pain Pain Assessment Pain Score: 7  Pain Location: R knee and LE Pain Descriptors / Indicators: Aching, Constant, Grimacing, Operative site  guarding Pain Intervention(s): Limited activity within patient's tolerance, Monitored during session, Premedicated before session, Ice applied, Repositioned    Home Living                          Prior Function            PT Goals (current goals can now be found in the care plan section) Acute Rehab PT Goals Patient Stated Goal: to be back to normal and get the other knee done PT Goal  Formulation: With patient/family Time For Goal Achievement: 08/02/24 Potential to Achieve Goals: Good Progress towards PT goals: Goals met/education completed, patient discharged from PT    Frequency    7X/week      PT Plan      Co-evaluation              AM-PAC PT 6 Clicks Mobility   Outcome Measure  Help needed turning from your back to your side while in a flat bed without using bedrails?: None Help needed moving from lying on your back to sitting on the side of a flat bed without using bedrails?: None Help needed moving to and from a bed to a chair (including a wheelchair)?: None Help needed standing up from a chair using your arms (e.g., wheelchair or bedside chair)?: None Help needed to walk in hospital room?: None Help needed climbing 3-5 steps with a railing? : None 6 Click Score: 24    End of Session Equipment Utilized During Treatment: Gait belt Activity Tolerance: Patient limited by pain Patient left: in chair;with call bell/phone within reach;with family/visitor present Nurse Communication: Mobility status PT Visit Diagnosis: Unsteadiness on feet (R26.81);Other abnormalities of gait and mobility (R26.89);Muscle weakness (generalized) (M62.81);Difficulty in walking, not elsewhere classified (R26.2);Pain Pain - Right/Left: Right Pain - part of body: Knee;Leg     Time: 9148-9074 PT Time Calculation (min) (ACUTE ONLY): 34 min  Charges:    $Gait Training: 8-22 mins $Therapeutic Exercise: 8-22 mins PT General Charges $$ ACUTE PT VISIT: 1 Visit                     Sylvan Delon Copp PT 07/20/2024  Acute Rehabilitation Services  Office [phone]

## 2024-07-20 NOTE — TOC Initial Note (Signed)
 Transition of Care (TOC) - Initial/Assessment Note    Patient Details  Name: Bruce Chambers. MRN: [id number] Date of Birth: 1960/12/07  Transition of Care [facility]) CM/SW Contact:    Sonda Manuella Quill, RN Phone Number: 07/20/2024, 10:31 AM  Clinical Narrative:                 Spoke w/ pt in room; pt said he lives at home w/ his spouse Ichael Pullara [phone]); he plans to return w/ her support at d/c; she will provide transportation; insurance/PCP verified; he denied SDOH risks; pt has cane, crutches, and BSC; HHPT was arranged w/ [facility] by Dr Damian office; he does not have home oxygen; pt agreed to receive recc RW; he does not have agency preference; referral given to Jermaine at [place]; he said RW will be delivered to pt's room before d/c; agency contact info placed in follow up provider section of d/c instructions; no IP CM needs.  Expected Discharge Plan: Home w Home Health Services Barriers to Discharge: No Barriers Identified   Patient Goals and CMS Choice Patient states their goals for this hospitalization and ongoing recovery are:: home          Expected Discharge Plan and Services   Discharge Planning Services: CM Consult   Living arrangements for the past 2 months: Single Family Home                 DME Arranged: Walker rolling DME Agency: [employer] Date DME Agency Contacted: 07/20/24 Time DME Agency Contacted: 1027 Representative spoke with at DME Agency: [place] HH Arranged: PT HH Agency: [facility] Date HH Agency Contacted: 07/20/24 Time HH Agency Contacted: 1028 Representative spoke with at [facility] Agency: Arranged by Dr Damian Office  Prior Living Arrangements/Services Living arrangements for the past 2 months: Single Family Home Lives with:: Spouse Patient language and need for interpreter reviewed:: Yes Do you feel safe going back to the place where you live?: Yes      Need for Family Participation in Patient Care: Yes  (Comment) Care giver support system in place?: Yes (comment) Current home services: DME (cane, crutches, BSC) Criminal Activity/Legal Involvement Pertinent to Current Situation/Hospitalization: No - Comment as needed  Activities of Daily Living   ADL Screening (condition at time of admission) Independently performs ADLs?: Yes (appropriate for developmental age) Is the patient deaf or have difficulty hearing?: No Does the patient have difficulty seeing, even when wearing glasses/contacts?: No Does the patient have difficulty concentrating, remembering, or making decisions?: No  Permission Sought/Granted Permission sought to share information with : Case Manager Permission granted to share information with : Yes, Verbal Permission Granted  Share Information with NAME: Case Manager     Permission granted to share info w Relationship: Graeden Bitner (spouse) [phone]     Emotional Assessment Appearance:: Appears stated age Attitude/Demeanor/Rapport: Gracious Affect (typically observed): Accepting Orientation: : Oriented to Self, Oriented to Place, Oriented to  Time, Oriented to Situation Alcohol / Substance Use: Not Applicable Psych Involvement: No (comment)  Admission diagnosis:  Unilateral primary osteoarthritis, right knee [M17.11] Status post total right knee replacement [Z96.651] Patient Active Problem List   Diagnosis Date Noted   Status post total right knee replacement 07/19/2024   Bipolar II disorder (HCC) 03/25/2024   Bipolar affect, depressed (HCC) 03/24/2024   Unilateral primary osteoarthritis, right knee 01/15/2024   Unilateral primary osteoarthritis, left knee 01/15/2024   Pure hypercholesterolemia 11/06/2023   Idiopathic peripheral neuropathy 11/06/2023   Coronary  artery disease of native artery of native heart with stable angina pectoris 09/27/2022   Spinal stenosis of lumbar region with neurogenic claudication 09/07/2022   Psoriasis 09/07/2022   Acid reflux  03/06/2018   BP (high blood pressure) 03/06/2018   Brash 03/06/2018   Dermatitis, eczematoid 03/06/2018   HLD (hyperlipidemia) 03/06/2018   Acute lower GI bleeding 02/05/2015   Leg pain 07/02/2014   Low back pain with sciatica 07/02/2014   Numbness and tingling 07/02/2014   PCP:  Alla Amis, MD Pharmacy:   [facility] - [place], [place] - [address] [place] [address] Phone: [phone] Fax: [phone]  [facility] - [place], [place] - [address] [place] [address] Phone: [phone] Fax: [phone]  [facility] - [place], [place] - [address] [place] [address] Phone: [phone] Fax: [phone]  [facility] - [place], [place] - [address] [place] [address] Phone: [phone] Fax: [phone]     Social Drivers of Health (SDOH) Social History: SDOH Screenings   Food Insecurity: No Food Insecurity (07/20/2024)  Housing: Low Risk (07/20/2024)  Transportation Needs: No Transportation Needs (07/20/2024)  Utilities: Not At Risk (07/20/2024)  Alcohol Screen: Low Risk (03/24/2024)  Depression (PHQ2-9): High Risk (04/10/2024)  Financial Resource Strain: Low Risk  (04/29/2024)   Received from [facility]  Social Connections: Unknown (03/24/2024)  Tobacco Use: Low Risk (07/19/2024)   SDOH Interventions: Food Insecurity Interventions: Intervention Not Indicated, Inpatient TOC Housing Interventions: Intervention Not Indicated, Inpatient TOC Transportation Interventions: Intervention Not Indicated, Inpatient TOC Utilities Interventions: Intervention Not Indicated, Inpatient TOC   Readmission Risk Interventions     No data to display

## 2024-07-25 ENCOUNTER — Other Ambulatory Visit: Payer: Self-pay

## 2024-07-25 ENCOUNTER — Other Ambulatory Visit (HOSPITAL_COMMUNITY): Payer: Self-pay

## 2024-07-25 ENCOUNTER — Other Ambulatory Visit: Payer: Self-pay | Admitting: Orthopaedic Surgery

## 2024-07-25 MED ORDER — OXYCODONE HCL 5 MG PO TABS
5.0000 mg | ORAL_TABLET | ORAL | 0 refills | Status: DC | PRN
  Filled 2024-07-25: qty 30, 3d supply, fill #0

## 2024-07-25 MED ORDER — TIZANIDINE HCL 4 MG PO TABS
4.0000 mg | ORAL_TABLET | Freq: Four times a day (QID) | ORAL | 0 refills | Status: AC | PRN
  Filled 2024-07-25: qty 30, 8d supply, fill #0

## 2024-07-31 ENCOUNTER — Encounter (HOSPITAL_COMMUNITY): Payer: Self-pay | Admitting: Orthopaedic Surgery

## 2024-08-01 ENCOUNTER — Ambulatory Visit: Admitting: Orthopaedic Surgery

## 2024-08-01 DIAGNOSIS — Z96651 Presence of right artificial knee joint: Secondary | ICD-10-CM

## 2024-08-01 MED ORDER — OXYCODONE HCL 5 MG PO TABS: 5.0000 mg | ORAL_TABLET | Freq: Four times a day (QID) | ORAL | 0 refills | Status: AC | PRN

## 2024-08-01 NOTE — Progress Notes (Signed)
 The patient is a 64 year old gentleman who is here today for his first postoperative visit status post a right total knee replacement to treat significant right knee pain and arthritis.  He says it has been quite painful.  He has had home therapy.  His extension is almost full he states and his flexion has gotten to 90 degrees.  On exam his calf is soft.  He is on chronic aspirin  and Plavix .  His incision looks good and staples are removed from his right knee and Steri-Strips applied.  His dorsiflexion his foot is intact.  He will transition to outpatient physical therapy in [place] at [facility].  I did refill his oxycodone  and actually encouraged him to take the pain medication especially during therapy.  We will see him back in a month to see how his range of motion is, along with no x-rays are needed.

## 2024-08-29 ENCOUNTER — Encounter: Admitting: Orthopaedic Surgery

## 2024-10-01 ENCOUNTER — Ambulatory Visit: Admitting: Dermatology
